# Patient Record
Sex: Female | Born: 1985 | Race: White | Hispanic: No | Marital: Single | Smoking: Never smoker
Health system: Southern US, Community
[De-identification: ages and names within clinical notes are randomized; demographics above are authoritative.]

## PROBLEM LIST (undated history)

## (undated) ENCOUNTER — Emergency Department: Discharge status: Absent without leave | Payer: Self-pay | Source: Home / Self Care

## (undated) HISTORY — PX: OTHER SURGICAL HISTORY: SHX169

---
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
## 2005-10-19 ENCOUNTER — Other Ambulatory Visit: Admission: RE | Admit: 2005-10-19 | Discharge: 2005-10-19 | Payer: Self-pay | Admitting: Gynecology

## 2006-10-22 ENCOUNTER — Other Ambulatory Visit: Admission: RE | Admit: 2006-10-22 | Discharge: 2006-10-22 | Payer: Self-pay | Admitting: Gynecology

## 2007-11-14 ENCOUNTER — Other Ambulatory Visit: Admission: RE | Admit: 2007-11-14 | Discharge: 2007-11-14 | Payer: Self-pay | Admitting: Gynecology

## 2008-05-10 ENCOUNTER — Ambulatory Visit: Payer: Self-pay | Admitting: Gynecology

## 2008-11-16 ENCOUNTER — Ambulatory Visit: Payer: Self-pay | Admitting: Gynecology

## 2008-11-16 ENCOUNTER — Other Ambulatory Visit: Admission: RE | Admit: 2008-11-16 | Discharge: 2008-11-16 | Payer: Self-pay | Admitting: Gynecology

## 2008-11-16 ENCOUNTER — Encounter: Payer: Self-pay | Admitting: Gynecology

## 2009-09-04 ENCOUNTER — Ambulatory Visit: Payer: Self-pay | Admitting: Gynecology

## 2009-09-26 ENCOUNTER — Encounter (INDEPENDENT_AMBULATORY_CARE_PROVIDER_SITE_OTHER): Payer: Self-pay | Admitting: *Deleted

## 2009-10-02 ENCOUNTER — Ambulatory Visit: Payer: Self-pay | Admitting: Infectious Diseases

## 2009-10-02 LAB — CONVERTED CEMR LAB: Chlamydia, DNA Probe: NEGATIVE

## 2009-10-07 ENCOUNTER — Telehealth: Payer: Self-pay | Admitting: Infectious Diseases

## 2009-10-15 ENCOUNTER — Telehealth: Payer: Self-pay | Admitting: Infectious Diseases

## 2009-10-23 ENCOUNTER — Telehealth: Payer: Self-pay | Admitting: Infectious Diseases

## 2009-10-25 ENCOUNTER — Encounter: Payer: Self-pay | Admitting: Infectious Diseases

## 2009-11-18 ENCOUNTER — Other Ambulatory Visit: Admission: RE | Admit: 2009-11-18 | Discharge: 2009-11-18 | Payer: Self-pay | Admitting: Gynecology

## 2009-11-18 ENCOUNTER — Ambulatory Visit: Payer: Self-pay | Admitting: Gynecology

## 2009-12-16 ENCOUNTER — Ambulatory Visit: Payer: Self-pay | Admitting: Gynecology

## 2010-09-25 NOTE — Progress Notes (Signed)
Summary: Lab results   Phone Note Call from Patient   Caller: Patient Summary of Call: Calling for lab results .    Initial call taken by: Tomasita Morrow RN,  October 07, 2009 9:31 AM  Follow-up for Phone Call        probe shows BV. will tx with flagyl for 7 days. called and explained to pt. she will call back at end of week if she does not feel better or sooner if she cannot tolerate med.     New/Updated Medications: FLAGYL 500 MG TABS (METRONIDAZOLE) Take 1 tablet by mouth two times a day Prescriptions: FLAGYL 500 MG TABS (METRONIDAZOLE) Take 1 tablet by mouth two times a day  #14 x 0   Entered and Authorized by:   Johny Sax MD   Signed by:   Johny Sax MD on 10/07/2009   Method used:   Electronically to        [facility] [address]* (retail)       [address] [place], [place]  [address]       Ph: [phone] or [phone]       Fax: [phone]   RxID:   [id number]

## 2010-09-25 NOTE — Assessment & Plan Note (Signed)
Summary: NEW PT CHRONIC YEAST   CC:  new patient - chronic yeast.  History of Present Illness: 25 yo F with recurrent yeast infections for last 5-6 years. Has tried diflucan, boric acid, multiple creams without relief. Has d/ thickc- everyday. No burning or itching.  after using boric acid she has improvement but returns in 24 h. the creams provide no relief.  has had HIV testing negative pervious, last 2 years ago.  has been on OCP since 2005. Had d/c then, unchanged with starting OCP.   Preventive Screening-Counseling & Management  Alcohol-Tobacco     Alcohol drinks/day: 0     Smoking Status: never  Caffeine-Diet-Exercise     Caffeine use/day: sodas     Does Patient Exercise: yes     Type of exercise: gym membership     Exercise (avg: min/session): >60     Times/week: 4  Safety-Violence-Falls     Seat Belt Use: yes   Updated Prior Medication List: VITAMIN D3 1000 UNIT CAPS (CHOLECALCIFEROL) Take 2 tablets by mouth once a day per Dr. Carney Harder 0.15-0.02/0.01 MG (21/5) TABS (DESOGESTREL-ETHINYL ESTRADIOL) per Dr. Audie Box  Current Allergies (reviewed today): ! CECLOR Past History:  Past Medical History: Current Problems:  CANDIDIASIS, VAGINAL (ICD-112.1)  Family History: denies  Social History: Single Never Smoked Alcohol use-no  Review of Systems       nl BM, nl urination, wt steady, eats well, nl periods  (has gotten longer), no oral ulcers, no visual changes, no dyspareunia.   Vital Signs:  Patient profile:   25 year old female Height:      61 inches (154.94 cm) Weight:      114.6 pounds (52.09 kg) BMI:     21.73 Temp:     96.9 degrees F (36.06 degrees C) oral Pulse rate:   64 / minute BP sitting:   104 / 55  (right arm)  Vitals Entered By: Baxter Hire) (October 02, 2009 2:19 PM) CC: new patient - chronic yeast Is Patient Diabetic? No Pain Assessment Patient in pain? no      Nutritional Status BMI of 19 -24 = normal Nutritional  Status Detail appetite is good per patient  Does patient need assistance? Functional Status Self care Ambulation Normal   Physical Exam  General:  well-developed, well-nourished, and well-hydrated.   Eyes:  pupils equal, pupils round, and pupils reactive to light.   Mouth:  pharynx pink and moist and no exudates.  no ulcers. Neck:  no masses.   Lungs:  normal respiratory effort and normal breath sounds.   Heart:  normal rate, regular rhythm, and no murmur.   Abdomen:  soft, non-tender, and normal bowel sounds.   Genitalia:  no external lesions, or lesions at Os. there is dry, whitish, d/c present. no odor.  Extremities:  no edema.    Impression & Recommendations:  Problem # 1:  CANDIDIASIS, VAGINAL (ICD-112.1)  her course is fairly odd. I am less suspicious that this is chronic candida vaginits given the appearance, lack of symptoms, and lack of response. She said she has been tested for Diabetes, it would be worth testing her thyroid functions as well ( she does not want blood work done today). Other considerations would be physiologic leukorrhea, desquamative inflamatory vaginitis.  Will leave these as dx of exclusion, currently test her for BV, fungus, GC, chlamydia, trich.  she will call office on 2-14 to discuss results.   Orders: T-Culture, Fungus w/o Smear ([id number]) T-Wet Prep by  Molecular Probe [phone]) T-Culture, Genital [phone]) T- * Misc. Laboratory test [phone]) T- GC Chlamydia ([id number]) Consultation Level IV [phone])

## 2010-09-25 NOTE — Progress Notes (Signed)
Summary: Symptoms continue, doesn't understand referral to Derm  Phone Note Call from Patient Call back at Home Phone [phone]   Caller: Patient Reason for Call: Acute Illness, Talk to Doctor Summary of Call: Pt. shared that she continues to have symptoms.  When she was taking the medication that Dr. Ninetta Lights prescribed she was on her period and didn't notice that her symptoms had returned.  She did return to her GYN MD.  Dr. Audie Box tried another medication without success.  Pt. does not understand how a Dermatologist is going to help this problem.  Please advise. Jennet Maduro RN  October 23, 2009 11:15 AM   Follow-up for Phone Call        Pt has called again, she does not understand why Dr Ninetta Lights is sending her to the dermatologist for a vaginal problem. She would like to repeat another round of the flagyl since it may have helped a bit. She is not sure if it did not work because she was on her period.  I spoke with Dr Ninetta Lights and he suggest that she not repeat the Flagyl and make the derm appt for an eval. They may choose to treat her problem with a different medication. Pt was informed of the above.  She did not seem pleased. Tomasita Morrow RN  October 23, 2009 4:14 PM

## 2010-09-25 NOTE — Miscellaneous (Signed)
Summary: Problems and Medications  Clinical Lists Changes  Problems: Added new problem of CANDIDIASIS, VAGINAL (ICD-112.1) - recurrent - Signed Medications: Added new medication of VITAMIN D3 1000 UNIT CAPS (CHOLECALCIFEROL) Take 2 tablets by mouth once a day per Dr. Audie Box - Signed Added new medication of KARIVA 0.15-0.02/0.01 MG (21/5) TABS (DESOGESTREL-ETHINYL ESTRADIOL) per Dr. Audie Box - Signed  Appended Document: Problems, Medications, and Allergies

## 2010-09-25 NOTE — Miscellaneous (Signed)
Summary: Dermatology Specialist  Dermatology Specialist   Imported By: Florinda Marker 11/11/2009 13:45:50  _____________________________________________________________________  External Attachment:    Type:   Image     Comment:   External Document

## 2010-09-25 NOTE — Progress Notes (Signed)
Summary: patient still having sxs/TY  Phone Note Call from Patient Call back at Home Phone [phone]   Caller: Patient Call For: Johny Sax MD Summary of Call: Patient called stating that the medication didnt help her symptoms, would like to know what else she could do? Initial call taken by: Starleen Arms CMA,  October 15, 2009 9:32 AM  Follow-up for Phone Call        would have derm see her.      Appended Document: patient still having sxs/TY Pt notified the above. Per Dr Ninetta Lights labs were normal and did not show and infectious disease present. Laurell Josephs, RN   Appended Document: patient still having sxs/TY 10-17-09  Pt was informed via phone of the above. She will contact her gyn or primary care physician for derm. referral. Laurell Josephs

## 2010-09-25 NOTE — Miscellaneous (Signed)
Summary: HIPAA Restrictions  HIPAA Restrictions   Imported By: Florinda Marker 10/02/2009 15:37:46  _____________________________________________________________________  External Attachment:    Type:   Image     Comment:   External Document

## 2010-10-20 ENCOUNTER — Ambulatory Visit (INDEPENDENT_AMBULATORY_CARE_PROVIDER_SITE_OTHER): Payer: Commercial Managed Care - PPO | Admitting: Family Medicine

## 2010-10-20 ENCOUNTER — Encounter: Payer: Self-pay | Admitting: Family Medicine

## 2010-10-20 DIAGNOSIS — J069 Acute upper respiratory infection, unspecified: Secondary | ICD-10-CM

## 2010-10-30 NOTE — Assessment & Plan Note (Signed)
Summary: POSS SINUS INF./WSE(rm2)   Vital Signs:  Patient Profile:   25 Years Old Female CC:      sinus problems Height:     61 inches (154.94 cm) Weight:      110.5 pounds O2 Sat:      100 % O2 treatment:    Room Air Temp:     98.8 degrees F oral Resp:     18 per minute BP sitting:   107 / 69  (left arm) Cuff size:   sinregular  Vitals Entered By: Burnard Hawthorne RN (October 20, 2010 6:45 PM)                  Updated Prior Medication List: VITAMIN D3 1000 UNIT CAPS (CHOLECALCIFEROL) Take 2 tablets by mouth once a day per Dr. Carney Harder 0.15-0.02/0.01 MG (21/5) TABS (DESOGESTREL-ETHINYL ESTRADIOL) per Dr. Audie Box  Current Allergies (reviewed today): ! CECLORHistory of Present Illness Chief Complaint: sinus problems History of Present Illness:  Subjective: Patient complains of sore throat for 2 days. + cough No pleuritic pain No wheezing + nasal congestion ? post-nasal drainage + sinus pain/pressure No itchy/red eyes No earache No hemoptysis No SOB No fever/chills No nausea No vomiting No abdominal pain No diarrhea No skin rashes + fatigue No myalgias No headache Used OTC meds without relief   REVIEW OF SYSTEMS Constitutional Symptoms      Denies fever, chills, night sweats, weight loss, weight gain, and fatigue.  Eyes       Complains of eye pain.      Denies change in vision, eye discharge, glasses, contact lenses, and eye surgery. Ear/Nose/Throat/Mouth       Complains of sinus problems and sore throat.      Denies hearing loss/aids, change in hearing, ear pain, ear discharge, dizziness, frequent runny nose, frequent nose bleeds, hoarseness, and tooth pain or bleeding.  Respiratory       Complains of dry cough.      Denies productive cough, wheezing, shortness of breath, asthma, bronchitis, and emphysema/COPD.  Cardiovascular       Denies murmurs, chest pain, and tires easily with exhertion.    Gastrointestinal       Denies stomach pain,  nausea/vomiting, diarrhea, constipation, blood in bowel movements, and indigestion. Genitourniary       Denies painful urination, kidney stones, and loss of urinary control. Neurological       Denies paralysis, seizures, and fainting/blackouts. Musculoskeletal       Denies muscle pain, joint pain, joint stiffness, decreased range of motion, redness, swelling, muscle weakness, and gout.  Skin       Denies bruising, unusual mles/lumps or sores, and hair/skin or nail changes.  Psych       Denies mood changes, temper/anger issues, anxiety/stress, speech problems, depression, and sleep problems. Other Comments: states symptoms started Friday   Past History:  Family History: Reviewed history from 10/02/2009 and no changes required. denies  Social History: Reviewed history from 10/02/2009 and no changes required. Single Never Smoked Alcohol use-no   Objective:  Appearance:  Patient appears healthy, stated age, and in no acute distress  Eyes:  Pupils are equal, round, and reactive to light and accomdation.  Extraocular movement is intact.  Conjunctivae are not inflamed.  Ears:  Canals normal.  Tympanic membranes normal.   Nose:  Mildly congested; no sinus tenderness Pharynx:  Normal  Neck:  Supple.  Slightly tender shotty posterior nodes are palpated bilaterally.  Lungs:  Clear to auscultation.  Breath sounds are equal.  Heart:  Regular rate and rhythm without murmurs, rubs, or gallops.  Abdomen:  Nontender without masses or hepatosplenomegaly.  Bowel sounds are present.  No CVA or flank tenderness.  Skin:  no rash Assessment New Problems: UPPER RESPIRATORY INFECTION, ACUTE (ICD-465.9)  NO EVIDENCE BACTERIAL INFECTION TODAY  Plan New Medications/Changes: AZITHROMYCIN 250 MG TABS (AZITHROMYCIN) Two tabs by mouth on day 1, then 1 tab daily on days 2 through 5 (Rx void after 10/30/10)  #6 tabs x 0, 10/20/2010, Donna Christen MD BENZONATATE 200 MG CAPS (BENZONATATE) One by mouth hs as  needed cough  #12 x 0, 10/20/2010, Donna Christen MD  New Orders: Pulse Oximetry (single measurment) [04540] New Patient Level III [98119] Planning Comments:   Treat symptomatically for now:  Increase fluid intake, begin expectorant/decongestant, cough suppressant at bedtime.  If fever/chills/sweats persist, or if not improving 5 to 7 days begin Z-pack (given Rx to hold).  Followup with PCP if not improving 10 to 14 days.   The patient and/or caregiver has been counseled thoroughly with regard to medications prescribed including dosage, schedule, interactions, rationale for use, and possible side effects and they verbalize understanding.  Diagnoses and expected course of recovery discussed and will return if not improved as expected or if the condition worsens. Patient and/or caregiver verbalized understanding.  Prescriptions: AZITHROMYCIN 250 MG TABS (AZITHROMYCIN) Two tabs by mouth on day 1, then 1 tab daily on days 2 through 5 (Rx void after 10/30/10)  #6 tabs x 0   Entered and Authorized by:   Donna Christen MD   Signed by:   Donna Christen MD on 10/20/2010   Method used:   Print then Give to Patient   RxID:   [id number] BENZONATATE 200 MG CAPS (BENZONATATE) One by mouth hs as needed cough  #12 x 0   Entered and Authorized by:   Donna Christen MD   Signed by:   Donna Christen MD on 10/20/2010   Method used:   Print then Give to Patient   RxID:   [id number]   Patient Instructions: 1)  Take Mucinex D (guaifenesin with decongestant) twice daily for congestion. 2)  Increase fluid intake, rest. 3)  May use Afrin nasal spray (or generic oxymetazoline) twice daily for about 5 days.  Also recommend using saline nasal spray several times daily and/or saline nasal irrigation. 4)  Begin Azithromycin if not improving about 5 to 7 days or if persistent fever develops. 5)  Followup with family doctor if not improving 10 to 14 days.   Orders Added: 1)  Pulse Oximetry (single measurment)  [94760] 2)  New Patient Level III [01027]

## 2010-11-20 ENCOUNTER — Other Ambulatory Visit: Payer: Self-pay | Admitting: Gynecology

## 2010-11-20 ENCOUNTER — Encounter (INDEPENDENT_AMBULATORY_CARE_PROVIDER_SITE_OTHER): Payer: Commercial Managed Care - PPO | Admitting: Gynecology

## 2010-11-20 ENCOUNTER — Other Ambulatory Visit (HOSPITAL_COMMUNITY)
Admission: RE | Admit: 2010-11-20 | Discharge: 2010-11-20 | Disposition: A | Discharge status: Home / Self Care | Payer: Commercial Managed Care - PPO | Source: Ambulatory Visit | Attending: Gynecology | Admitting: Gynecology

## 2010-11-20 DIAGNOSIS — B373 Candidiasis of vulva and vagina: Secondary | ICD-10-CM

## 2010-11-20 DIAGNOSIS — Z833 Family history of diabetes mellitus: Secondary | ICD-10-CM

## 2010-11-20 DIAGNOSIS — Z01419 Encounter for gynecological examination (general) (routine) without abnormal findings: Secondary | ICD-10-CM

## 2010-11-20 DIAGNOSIS — Z124 Encounter for screening for malignant neoplasm of cervix: Secondary | ICD-10-CM | POA: Insufficient documentation

## 2010-11-20 DIAGNOSIS — Z13220 Encounter for screening for lipoid disorders: Secondary | ICD-10-CM

## 2010-11-20 DIAGNOSIS — R823 Hemoglobinuria: Secondary | ICD-10-CM

## 2010-11-20 DIAGNOSIS — Z113 Encounter for screening for infections with a predominantly sexual mode of transmission: Secondary | ICD-10-CM

## 2011-02-09 ENCOUNTER — Inpatient Hospital Stay (INDEPENDENT_AMBULATORY_CARE_PROVIDER_SITE_OTHER)
Admission: RE | Admit: 2011-02-09 | Discharge: 2011-02-09 | Disposition: A | Discharge status: Home / Self Care | Payer: Commercial Managed Care - PPO | Source: Ambulatory Visit | Attending: Family Medicine | Admitting: Family Medicine

## 2011-02-09 ENCOUNTER — Encounter: Payer: Self-pay | Admitting: Family Medicine

## 2011-02-09 DIAGNOSIS — R599 Enlarged lymph nodes, unspecified: Secondary | ICD-10-CM | POA: Insufficient documentation

## 2011-02-09 DIAGNOSIS — H6980 Other specified disorders of Eustachian tube, unspecified ear: Secondary | ICD-10-CM | POA: Insufficient documentation

## 2011-02-09 DIAGNOSIS — H6500 Acute serous otitis media, unspecified ear: Secondary | ICD-10-CM

## 2011-02-09 DIAGNOSIS — H6990 Unspecified Eustachian tube disorder, unspecified ear: Secondary | ICD-10-CM | POA: Insufficient documentation

## 2011-06-05 ENCOUNTER — Telehealth: Payer: Self-pay | Admitting: *Deleted

## 2011-06-05 NOTE — Telephone Encounter (Signed)
Pt called wanting prescription for depo-provera sent to her pharmacy. Last office note states pt was on birth control pills, pt informed she would need to get the depo from dr. Randa Evens office, being that she wrote Rx.

## 2011-07-27 NOTE — Progress Notes (Signed)
Summary: EARACHE/SWOLLEN GLAND rm 5   Vital Signs:  Patient Profile:   25 Years Old Female CC:      RT ear ache and lump behind RT ear x 5 days Height:     61 inches (154.94 cm) Weight:      109.50 pounds O2 Sat:      100 % O2 treatment:    Room Air Temp:     98.6 degrees F oral Pulse rate:   77 / minute Resp:     14 per minute BP sitting:   96 / 60  (left arm) Cuff size:   regular  Vitals Entered By: Clemens Catholic LPN (February 09, 2011 6:28 PM)                  Prior Medication List:  KARIVA 0.15-0.02/0.01 MG (21/5) TABS (DESOGESTREL-ETHINYL ESTRADIOL) per Dr. Audie Box BENZONATATE 200 MG CAPS (BENZONATATE) One by mouth hs as needed cough AZITHROMYCIN 250 MG TABS (AZITHROMYCIN) Two tabs by mouth on day 1, then 1 tab daily on days 2 through 5 (Rx void after 10/30/10)   Updated Prior Medication List: KARIVA 0.15-0.02/0.01 MG (21/5) TABS (DESOGESTREL-ETHINYL ESTRADIOL) per Dr. Audie Box  Current Allergies (reviewed today): ! CECLORHistory of Present Illness Chief Complaint: RT ear ache and lump behind RT ear x 5 days History of Present Illness: patient states ear pain for 2 days but painful lymph nodes f0r 5 days.  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 Valda Lamb [facility] 180-240 MG XR24H-TAB (FEXOFENADINE-PSEUDOEPHEDRINE) 1 by mouth q day AMOXICILLIN 875 MG TABS (AMOXICILLIN) 1 by mouth twwice a day DIFLUCAN 150 MG TABS (FLUCONAZOLE) once daily if neede for yeast infection  REVIEW OF SYSTEMS Constitutional Symptoms      Denies fever, chills, night sweats, weight loss, weight gain, and fatigue.  Eyes       Denies change in vision, eye pain, eye discharge, glasses, contact lenses, and eye surgery. Ear/Nose/Throat/Mouth       Complains of ear pain, ear  discharge, and sore throat.      Denies hearing loss/aids, change in hearing, dizziness, frequent runny nose, frequent nose bleeds, sinus problems, hoarseness, and tooth pain or bleeding.  Respiratory       Denies dry cough, productive cough, wheezing, shortness of breath, asthma, bronchitis, and emphysema/COPD.  Cardiovascular       Denies murmurs, chest pain, and tires easily with exhertion.    Gastrointestinal       Denies stomach pain, nausea/vomiting, diarrhea, constipation, blood in bowel movements, and indigestion. Genitourniary       Denies painful urination, kidney stones, and loss of urinary control. Neurological       Denies paralysis, seizures, and fainting/blackouts. Musculoskeletal       Denies muscle pain, joint pain, joint stiffness, decreased range of motion, redness, swelling, muscle weakness, and gout.  Skin       Denies bruising, unusual mles/lumps or sores, and hair/skin or nail changes.  Psych       Denies mood changes, temper/anger issues, anxiety/stress, speech problems, depression, and sleep problems. Blood-Lymph       Complains of unexplained lumps. Other Comments: pt c/o a lump behind her RT ear x 5 days, she developed Rt ear pain x yesterday. no fever. no OTC meds.   Past History:  Social History: Last updated: 10/02/2009 Single Never Smoked Alcohol use-no  Risk Factors: Alcohol Use: 0 (10/02/2009) Caffeine Use: sodas (10/02/2009) Exercise: yes (10/02/2009)  Risk Factors: Smoking Status: never (10/02/2009)  Past Medical History: Reviewed history from 10/02/2009 and no changes required. Current Problems:  CANDIDIASIS, VAGINAL (ICD-112.1)  Family History: Reviewed history from 10/02/2009 and no changes required. denies  Social History: Reviewed history from 10/02/2009 and no changes required. Single Never Smoked Alcohol use-no Physical Exam General appearance: well developed, well nourished,anxious Head: normocephalic, atraumatic Ears:  fluid noted without inflammation right TM Nasal: marked sinus and nasal congestion Oral/Pharynx: pharyngeal erythema without exudate, uvula midline without deviation Neck: supple tender R cervical anterir lymph node  Extremities: normal extremities Skin: no obvious rashes or lesions MSE: oriented to time, place, and person Assessment New Problems: CERVICAL LYMPHADENOPATHY (ICD-785.6) ACUTE SEROUS OTITIS MEDIA (ICD-381.01) EUSTACHIAN TUBE DYSFUNCTION, 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 Patient Level III [phone] Planning Comments:   if lymph nodes does not resolve uin 2-3 weeks please seePCP  Follow Up: Follow up with Primary Physician Follow Up: 2-3  The patient and/or caregiver has been counseled thoroughly with regard to medications prescribed including dosage, schedule, interactions, rationale for use, and possible side effects and they verbalize understanding.  Diagnoses and expected course of recovery discussed and will return if not improved as expected or if the condition worsens. Patient and/or caregiver verbalized understanding.  Prescriptions: DIFLUCAN 150 MG TABS (FLUCONAZOLE) once daily if neede for yeast infection  #1 x 0   Entered and Authorized by:   Hassan Rowan MD   Signed by:   Hassan Rowan MD on 02/09/2011   Method used:   Print then Give to Patient   RxID:   [id number] AMOXICILLIN 875 MG TABS (AMOXICILLIN) 1 by mouth twwice a day  #20 x 0   Entered and Authorized by:   Hassan Rowan MD   Signed by:   Hassan Rowan MD on 02/09/2011   Method used:   Print then Give to Patient    RxID:   [id number] ALLEGRA-D ALLERGY & CONGESTION 180-240 MG XR24H-TAB (FEXOFENADINE-PSEUDOEPHEDRINE) 1 by mouth q day  #30 x 0   Entered and Authorized by:   Hassan Rowan MD   Signed by:   Hassan Rowan MD on 02/09/2011   Method used:   Print then Give to Patient   RxID:   [id number]   Patient Instructions: 1)  before you take Amoxicillin make sure you have taken a peniciillin since the ceclor allergy 2)  folow up w/PCP if adenopathy is not better in 2-3 weeks 3)  Please schedule a follow-up appointment as needed. 4)  Please schedule an appointment with your primary doctor in :2-3 weks 5)  Take your antibiotic as prescribed until ALL of it is gone, but stop if you develop a rash or swelling and contact our office as soon as possible.  Orders Added: 1)  Est. Patient Level III [25366]

## 2011-08-30 ENCOUNTER — Encounter: Payer: Self-pay | Admitting: Emergency Medicine

## 2011-08-30 ENCOUNTER — Emergency Department
Admission: EM | Admit: 2011-08-30 | Discharge: 2011-08-30 | Disposition: A | Discharge status: Home / Self Care | Payer: 59 | Source: Home / Self Care | Attending: Emergency Medicine | Admitting: Emergency Medicine

## 2011-08-30 DIAGNOSIS — R350 Frequency of micturition: Secondary | ICD-10-CM

## 2011-08-30 DIAGNOSIS — IMO0001 Reserved for inherently not codable concepts without codable children: Secondary | ICD-10-CM

## 2011-08-30 DIAGNOSIS — R300 Dysuria: Secondary | ICD-10-CM

## 2011-08-30 LAB — POCT URINALYSIS DIPSTICK
Bilirubin, UA: NEGATIVE
Ketones, UA: NEGATIVE
Leukocytes, UA: NEGATIVE
Spec Grav, UA: 1.02 (ref 1.005–1.03)

## 2011-08-30 MED ORDER — CIPROFLOXACIN HCL 500 MG PO TABS: 500.0000 mg | ORAL_TABLET | Freq: Two times a day (BID) | ORAL | Status: AC

## 2011-08-30 NOTE — ED Notes (Signed)
Frequency of urination x 4 days; dysuria started today.

## 2011-08-30 NOTE — ED Provider Notes (Signed)
History     CSN: [id number]  Arrival date & time 08/30/11  1502   First MD Initiated Contact with Patient 08/30/11 1526      No chief complaint on file.   (Consider location/radiation/quality/duration/timing/severity/associated sxs/prior treatment) HPI Amy Weeks is a 26 y.o. female who presents today with UTI symptoms for 4 days.  These symptoms similar to previous UTIs. ? dysuria + frequency No urgency No hematuria No vaginal discharge No fever/chills No lower abdominal pain No back pain No fatigue    No past medical history on file.  No past surgical history on file.  No family history on file.  History  Substance Use Topics  . Smoking status: Not on file  . Smokeless tobacco: Not on file  . Alcohol Use: Not on file    OB History    No data available      Review of Systems  Allergies  Cefaclor  Home Medications  No current outpatient prescriptions on file.  There were no vitals taken for this visit.  Physical Exam  Nursing note and vitals reviewed. Constitutional: She is oriented to person, place, and time. She appears well-developed and well-nourished.  HENT:  Head: Normocephalic and atraumatic.  Eyes: No scleral icterus.  Neck: Neck supple.  Cardiovascular: Regular rhythm and normal heart sounds.   Pulmonary/Chest: Effort normal and breath sounds normal. No respiratory distress.  Abdominal: Soft. Normal appearance and bowel sounds are normal. She exhibits no mass. There is no rebound, no guarding and no CVA tenderness.  Neurological: She is alert and oriented to person, place, and time.  Skin: Skin is warm and dry.  Psychiatric: She has a normal mood and affect. Her speech is normal.    ED Course  Procedures (including critical care time)  Labs Reviewed - No data to display No results found.   No diagnosis found.    MDM  1) Take the prescribed antibiotic as directed. 2) A urinalysis was done in clinic.  A urine culture is pending. 3)  Follow up with your PCP or urologist if not improving or if worsening symptoms.   Lily Kocher, MD 08/30/11 1538

## 2011-09-02 ENCOUNTER — Telehealth: Payer: Self-pay | Admitting: Emergency Medicine

## 2011-12-02 ENCOUNTER — Other Ambulatory Visit: Payer: Self-pay | Admitting: Gynecology

## 2011-12-07 ENCOUNTER — Other Ambulatory Visit: Payer: Self-pay | Admitting: Obstetrics and Gynecology

## 2012-04-22 ENCOUNTER — Emergency Department
Admission: EM | Admit: 2012-04-22 | Discharge: 2012-04-22 | Disposition: A | Discharge status: Home / Self Care | Payer: 59 | Source: Home / Self Care

## 2012-04-22 ENCOUNTER — Encounter: Payer: Self-pay | Admitting: *Deleted

## 2012-04-22 DIAGNOSIS — J029 Acute pharyngitis, unspecified: Secondary | ICD-10-CM

## 2012-04-22 DIAGNOSIS — J020 Streptococcal pharyngitis: Secondary | ICD-10-CM

## 2012-04-22 LAB — POCT RAPID STREP A (OFFICE): Rapid Strep A Screen: NEGATIVE

## 2012-04-22 MED ORDER — FLUCONAZOLE 150 MG PO TABS: 150.0000 mg | ORAL_TABLET | Freq: Once | ORAL | Status: AC

## 2012-04-22 MED ORDER — PENICILLIN G BENZATHINE 1200000 UNIT/2ML IM SUSP
1.2000 10*6.[IU] | Freq: Once | INTRAMUSCULAR | Status: AC
  Administered 2012-04-22: 1.2 10*6.[IU] via INTRAMUSCULAR

## 2012-04-22 NOTE — ED Notes (Signed)
Pt c/o sore throat and bilateral ear ache x 2 days. She has taken mucinex and advil.

## 2012-04-22 NOTE — ED Provider Notes (Signed)
History     CSN: [id number]  Arrival date & time 04/22/12  1704   First MD Initiated Contact with Patient 04/22/12 1707      Chief Complaint  Patient presents with  . Sore Throat   HPI SORE THROAT  Onset: 2 days  Description: sore throat, generalized malaise  Modifying factors: hx/o strep throat in the past  Symptoms  Fever:  no URI symptoms: no Cough: no Headache: yes Rash:  no Swollen glands:   yes Recent Strep Exposure: unsure LUQ pain: no Heartburn/brash: no Allergy Symptoms: no  Red Flags STD exposure: no Breathing difficulty: no Drooling: no Trismus: no   History reviewed. No pertinent past medical history.  Past Surgical History  Procedure Date  . Tubes in ears     Family History  Problem Relation Age of Onset  . Hypertension Mother     History  Substance Use Topics  . Smoking status: Never Smoker   . Smokeless tobacco: Not on file  . Alcohol Use: No    OB History    Grav Para Term Preterm Abortions TAB SAB Ect Mult Living                  Review of Systems  All other systems reviewed and are negative.    Allergies  Ceclor  Home Medications   Current Outpatient Rx  Name Route Sig Dispense Refill  . MEDROXYPROGESTERONE ACETATE 150 MG/ML IM SUSP Intramuscular Inject 150 mg into the muscle every 3 (three) months.      Marland Kitchen VIORELE 0.15-0.02/0.01 MG (21/5) PO TABS  TAKE 1 TABLET BY MOUTH EVERY DAY AS DIRECTED 28 tablet 0    Patient due for annual exam and needs to schedule.    BP 111/74  Pulse 91  Temp 98.3 F (36.8 C) (Oral)  Resp 18  Ht 5\' 1"  (1.549 m)  Wt 112 lb (50.803 kg)  BMI 21.16 kg/m2  SpO2 99%  LMP 04/13/2012  Physical Exam  Constitutional: She appears well-developed and well-nourished.  HENT:  Head: Normocephalic and atraumatic.  Mouth/Throat: Oropharyngeal exudate present.  Eyes: Conjunctivae are normal. Pupils are equal, round, and reactive to light.  Neck: Normal range of motion. Neck supple.    Cardiovascular: Normal rate and regular rhythm.   Pulmonary/Chest: Effort normal and breath sounds normal.  Abdominal: Soft.  Musculoskeletal: Normal range of motion.  Lymphadenopathy:    She has cervical adenopathy.  Neurological: She is alert.  Skin: Skin is warm.    ED Course  Procedures (including critical care time)   Labs Reviewed  POCT RAPID STREP A (OFFICE)  STREP A DNA PROBE   No results found.   1. Strep throat   2. Pharyngitis       MDM  Rapid strep negative. Centor Score of 3/4 Will treat with bicillin. Pt states she has had this before and has treated this effectively in setting on ceclor allergy.   Throat culture. Discussed infectious red flags.  Follow up as needed.     The patient and/or caregiver has been counseled thoroughly with regard to treatment plan and/or medications prescribed including dosage, schedule, interactions, rationale for use, and possible side effects and they verbalize understanding. Diagnoses and expected course of recovery discussed and will return if not improved as expected or if the condition worsens. Patient and/or caregiver verbalized understanding.           Doree Albee, MD 04/22/12 [phone]

## 2012-04-23 LAB — STREP A DNA PROBE: GASP: NEGATIVE

## 2012-12-14 ENCOUNTER — Other Ambulatory Visit: Payer: Self-pay | Admitting: Obstetrics and Gynecology

## 2013-03-02 ENCOUNTER — Encounter: Payer: Self-pay | Admitting: Internal Medicine

## 2013-03-02 ENCOUNTER — Ambulatory Visit (INDEPENDENT_AMBULATORY_CARE_PROVIDER_SITE_OTHER): Payer: Managed Care, Other (non HMO) | Admitting: Internal Medicine

## 2013-03-02 VITALS — BP 108/72 | HR 77 | Temp 97.3°F | Ht 61.0 in | Wt 116.0 lb

## 2013-03-02 DIAGNOSIS — J019 Acute sinusitis, unspecified: Secondary | ICD-10-CM

## 2013-03-02 MED ORDER — AMOXICILLIN-POT CLAVULANATE 875-125 MG PO TABS: 1.0000 | ORAL_TABLET | Freq: Two times a day (BID) | ORAL | Status: DC

## 2013-03-02 NOTE — Patient Instructions (Signed)

## 2013-03-02 NOTE — Progress Notes (Signed)
HPI  Pt presents to the clinic today with c/o headache, facial pain, pressure, nasal congestion, fatigue and fever. This started 1 month ago. She is having green nasal drainage. She has taken Advil and Sudafed which has only helped a little. She has no history of allergies or asthma. She has not had sick contacts.  Review of Systems   History reviewed. No pertinent past medical history.  Family History  Problem Relation Age of Onset  . Hypertension Mother     History   Social History  . Marital Status: Single    Spouse Name: N/A    Number of Children: N/A  . Years of Education: N/A   Occupational History  . Not on file.   Social History Main Topics  . Smoking status: Never Smoker   . Smokeless tobacco: Not on file  . Alcohol Use: No  . Drug Use: No  . Sexually Active: Not on file   Other Topics Concern  . Not on file   Social History Narrative  . No narrative on file    Allergies  Allergen Reactions  . Ceclor (Cefaclor)      Constitutional: Positive headache, fatigue and fever. Denies abrupt weight changes.  HEENT:  Positive eye pain, pressure behind the eyes, facial pain, nasal congestion and sore throat. Denies eye redness, ear pain, ringing in the ears, wax buildup, runny nose or bloody nose. Respiratory: Positive cough and thick green sputum production. Denies difficulty breathing or shortness of breath.  Cardiovascular: Denies chest pain, chest tightness, palpitations or swelling in the hands or feet.   No other specific complaints in a complete review of systems (except as listed in HPI above).  Objective:    BP 108/72  Pulse 77  Temp(Src) 97.3 F (36.3 C) (Oral)  Ht 5\' 1"  (1.549 m)  Wt 116 lb (52.617 kg)  BMI 21.93 kg/m2  SpO2 99% Wt Readings from Last 3 Encounters:  03/02/13 116 lb (52.617 kg)  04/22/12 112 lb (50.803 kg)  08/30/11 110 lb (49.896 kg)    General: Appears her stated age, well developed, well nourished in NAD. HEENT: Head:  normal shape and size; Eyes: sclera white, no icterus, conjunctiva pink, PERRLA and EOMs intact; Ears: Tm's gray and intact, normal light reflex; Nose: mucosa pink and moist, septum midline; Throat/Mouth: + PND. Teeth present, mucosa pink and moist, no exudate noted, no lesions or ulcerations noted.  Neck: Mild cervical lymphadenopathy. Neck supple, trachea midline. No massses, lumps or thyromegaly present.  Cardiovascular: Normal rate and rhythm. S1,S2 noted.  No murmur, rubs or gallops noted. No JVD or BLE edema. No carotid bruits noted. Pulmonary/Chest: Normal effort and positive vesicular breath sounds. No respiratory distress. No wheezes, rales or ronchi noted.      Assessment & Plan:   Acute bacterial sinusitis  Can use a Neti Pot which can be purchased from your local drug store. Flonase 2 sprays each nostril for 3 days and then as needed. Augmentin BID for 10 days  RTC as needed or if symptoms persist.

## 2013-03-13 ENCOUNTER — Telehealth: Payer: Self-pay | Admitting: Internal Medicine

## 2013-03-13 ENCOUNTER — Other Ambulatory Visit: Payer: Self-pay | Admitting: Internal Medicine

## 2013-03-13 MED ORDER — DOXYCYCLINE HYCLATE 100 MG PO TABS: 100.0000 mg | ORAL_TABLET | Freq: Two times a day (BID) | ORAL | Status: DC

## 2013-03-13 NOTE — Telephone Encounter (Signed)
Pt is aware.  

## 2013-03-13 NOTE — Telephone Encounter (Signed)
LMOM to call.

## 2013-03-13 NOTE — Telephone Encounter (Signed)
Pt is having an allergic reaction to the antibiotics.  Throat was swelling, tongue swelled some.  She took benadryl and stopped the antibiotic.  She is better, but still needs an antibiotic.

## 2013-03-13 NOTE — Telephone Encounter (Signed)
Please call pt.  I added augmentin to her allergy list. Have called in eRx for Doxycycline.

## 2013-04-21 ENCOUNTER — Other Ambulatory Visit (INDEPENDENT_AMBULATORY_CARE_PROVIDER_SITE_OTHER): Payer: Managed Care, Other (non HMO)

## 2013-04-21 ENCOUNTER — Ambulatory Visit (INDEPENDENT_AMBULATORY_CARE_PROVIDER_SITE_OTHER): Payer: Managed Care, Other (non HMO) | Admitting: Internal Medicine

## 2013-04-21 ENCOUNTER — Encounter: Payer: Self-pay | Admitting: Internal Medicine

## 2013-04-21 VITALS — BP 98/64 | HR 76 | Temp 98.0°F | Wt 114.0 lb

## 2013-04-21 DIAGNOSIS — Z13220 Encounter for screening for lipoid disorders: Secondary | ICD-10-CM

## 2013-04-21 DIAGNOSIS — Z0000 Encounter for general adult medical examination without abnormal findings: Secondary | ICD-10-CM

## 2013-04-21 DIAGNOSIS — M5430 Sciatica, unspecified side: Secondary | ICD-10-CM

## 2013-04-21 DIAGNOSIS — Z131 Encounter for screening for diabetes mellitus: Secondary | ICD-10-CM

## 2013-04-21 DIAGNOSIS — Z23 Encounter for immunization: Secondary | ICD-10-CM

## 2013-04-21 DIAGNOSIS — Z130 Encounter for screening for diseases of the blood and blood-forming organs and certain disorders involving the immune mechanism: Secondary | ICD-10-CM

## 2013-04-21 LAB — CBC
Hemoglobin: 14.3 g/dL (ref 12.0–15.0)
MCHC: 34.6 g/dL (ref 30.0–36.0)
RDW: 13.1 % (ref 11.5–14.6)

## 2013-04-21 LAB — COMPREHENSIVE METABOLIC PANEL
Albumin: 3.7 g/dL (ref 3.5–5.2)
Alkaline Phosphatase: 35 U/L — ABNORMAL LOW (ref 39–117)
BUN: 8 mg/dL (ref 6–23)
Glucose, Bld: 84 mg/dL (ref 70–99)
Potassium: 4.3 mEq/L (ref 3.5–5.1)

## 2013-04-21 LAB — LIPID PANEL
Cholesterol: 203 mg/dL — ABNORMAL HIGH (ref 0–200)
HDL: 63.4 mg/dL (ref >39.00)
Triglycerides: 107 mg/dL (ref 0.0–149.0)

## 2013-04-21 LAB — LDL CHOLESTEROL, DIRECT: Direct LDL: 129.9 mg/dL

## 2013-04-21 MED ORDER — PREDNISONE 10 MG PO TABS: ORAL_TABLET | ORAL | Status: DC

## 2013-04-21 NOTE — Progress Notes (Signed)
HPI  Pt presents to the clinic today to establish care. She does not have a PCP. She does have some concerns today about pain in her lower back. This started 3 weeks ago. She denies any specific injury to it but she did notice it after working out with [employer]. The pain is in the middle of her back and has a sharp radiating pain down both of her legs, with certain movements. The pain seems to be worse we she sits or bends, but better if she stands up. She has take Advil with some relief.  Flu: never Tetanus: more than 10 years ago LMP: on continuous birth control Pap smear: 11/2011 Dentist: yearly  History reviewed. No pertinent past medical history.  Current Outpatient Prescriptions  Medication Sig Dispense Refill  . VIORELE 0.15-0.02/0.01 MG (21/5) tablet TAKE 1 TABLET BY MOUTH EVERY DAY AS DIRECTED  28 tablet  0  . doxycycline (VIBRA-TABS) 100 MG tablet Take 1 tablet (100 mg total) by mouth 2 (two) times daily.  20 tablet  0  . predniSONE (DELTASONE) 10 MG tablet Take 3 tablets on days 1-3, take 2 tablets on days 4-6, take 1 tablet on days 7-9  18 tablet  0   No current facility-administered medications for this visit.    Allergies  Allergen Reactions  . Augmentin [Amoxicillin-Pot Clavulanate] Swelling  . Ceclor [Cefaclor]     Family History  Problem Relation Age of Onset  . Hypertension Mother   . Cancer Neg Hx   . Diabetes Neg Hx   . Stroke Neg Hx     History   Social History  . Marital Status: Single    Spouse Name: N/A    Number of Children: N/A  . Years of Education: N/A   Occupational History  . Not on file.   Social History Main Topics  . Smoking status: Never Smoker   . Smokeless tobacco: Not on file  . Alcohol Use: No  . Drug Use: No  . Sexual Activity: Yes    Birth Control/ Protection: Pill   Other Topics Concern  . Not on file   Social History Narrative  . No narrative on file    ROS:  Constitutional: Denies fever, malaise, fatigue,  headache or abrupt weight changes.  HEENT: Denies eye pain, eye redness, ear pain, ringing in the ears, wax buildup, runny nose, nasal congestion, bloody nose, or sore throat. Respiratory: Denies difficulty breathing, shortness of breath, cough or sputum production.   Cardiovascular: Denies chest pain, chest tightness, palpitations or swelling in the hands or feet.  Gastrointestinal: Denies abdominal pain, bloating, constipation, diarrhea or blood in the stool.  GU: Denies frequency, urgency, pain with urination, blood in urine, odor or discharge. Musculoskeletal: Pt reports low back pain. Denies decrease in range of motion, difficulty with gait, muscle pain or joint pain and swelling.  Skin: Denies redness, rashes, lesions or ulcercations.  Neurological: Pt reports sharp, shooting pain down her thighs. Denies dizziness, difficulty with memory, difficulty with speech or problems with balance and coordination.   No other specific complaints in a complete review of systems (except as listed in HPI above).  PE:  BP 98/64  Pulse 76  Temp(Src) 98 F (36.7 C) (Oral)  Wt 114 lb (51.71 kg)  BMI 21.55 kg/m2  SpO2 99% Wt Readings from Last 3 Encounters:  04/21/13 114 lb (51.71 kg)  03/02/13 116 lb (52.617 kg)  04/22/12 112 lb (50.803 kg)    General: Appears her stated  age, well developed, well nourished in NAD. HEENT: Head: normal shape and size; Eyes: sclera white, no icterus, conjunctiva pink, PERRLA and EOMs intact; Ears: Tm's gray and intact, normal light reflex; Nose: mucosa pink and moist, septum midline; Throat/Mouth: Teeth present, mucosa pink and moist, no lesions or ulcerations noted.  Neck: Normal range of motion. Neck supple, trachea midline. No massses, lumps or thyromegaly present.  Cardiovascular: Normal rate and rhythm. S1,S2 noted.  No murmur, rubs or gallops noted. No JVD or BLE edema. No carotid bruits noted. Pulmonary/Chest: Normal effort and positive vesicular breath sounds.  No respiratory distress. No wheezes, rales or ronchi noted.  Abdomen: Soft and nontender. Normal bowel sounds, no bruits noted. No distention or masses noted. Liver, spleen and kidneys non palpable. Musculoskeletal: Normal range of motion. No signs of joint swelling. No difficulty with gait. Tender to palpation of the lumbar spine. Neurological: Alert and oriented. Cranial nerves II-XII intact. Coordination normal. +DTRs bilaterally. Psychiatric: Mood and affect normal. Behavior is normal. Judgment and thought content normal. Positive straight leg raise on the right.     Assessment and Plan:  Preventative Health:  Pt declines flu shot today Tdap given today Will obtain basic screening labs today Continue to follow with dentist yearly Paps every 3 years

## 2013-04-21 NOTE — Patient Instructions (Addendum)
Health Maintenance, Females A healthy lifestyle and preventative care can promote health and wellness.  Maintain regular health, dental, and eye exams.  Eat a healthy diet. Foods like vegetables, fruits, whole grains, low-fat dairy products, and lean protein foods contain the nutrients you need without too many calories. Decrease your intake of foods high in solid fats, added sugars, and salt. Get information about a proper diet from your caregiver, if necessary.  Regular physical exercise is one of the most important things you can do for your health. Most adults should get at least 150 minutes of moderate-intensity exercise (any activity that increases your heart rate and causes you to sweat) each week. In addition, most adults need muscle-strengthening exercises on 2 or more days a week.   Maintain a healthy weight. The body mass index (BMI) is a screening tool to identify possible weight problems. It provides an estimate of body fat based on height and weight. Your caregiver can help determine your BMI, and can help you achieve or maintain a healthy weight. For adults 20 years and older:  A BMI below 18.5 is considered underweight.  A BMI of 18.5 to 24.9 is normal.  A BMI of 25 to 29.9 is considered overweight.  A BMI of 30 and above is considered obese.  Maintain normal blood lipids and cholesterol by exercising and minimizing your intake of saturated fat. Eat a balanced diet with plenty of fruits and vegetables. Blood tests for lipids and cholesterol should begin at age 20 and be repeated every 5 years. If your lipid or cholesterol levels are high, you are over 50, or you are a high risk for heart disease, you may need your cholesterol levels checked more frequently.Ongoing high lipid and cholesterol levels should be treated with medicines if diet and exercise are not effective.  If you smoke, find out from your caregiver how to quit. If you do not use tobacco, do not start.  If you  are pregnant, do not drink alcohol. If you are breastfeeding, be very cautious about drinking alcohol. If you are not pregnant and choose to drink alcohol, do not exceed 1 drink per day. One drink is considered to be 12 ounces (355 mL) of beer, 5 ounces (148 mL) of wine, or 1.5 ounces (44 mL) of liquor.  Avoid use of street drugs. Do not share needles with anyone. Ask for help if you need support or instructions about stopping the use of drugs.  High blood pressure causes heart disease and increases the risk of stroke. Blood pressure should be checked at least every 1 to 2 years. Ongoing high blood pressure should be treated with medicines, if weight loss and exercise are not effective.  If you are 55 to 27 years old, ask your caregiver if you should take aspirin to prevent strokes.  Diabetes screening involves taking a blood sample to check your fasting blood sugar level. This should be done once every 3 years, after age 45, if you are within normal weight and without risk factors for diabetes. Testing should be considered at a younger age or be carried out more frequently if you are overweight and have at least 1 risk factor for diabetes.  Breast cancer screening is essential preventative care for women. You should practice "breast self-awareness." This means understanding the normal appearance and feel of your breasts and may include breast self-examination. Any changes detected, no matter how small, should be reported to a caregiver. Women in their 20s and 30s should have   a clinical breast exam (CBE) by a caregiver as part of a regular health exam every 1 to 3 years. After age 40, women should have a CBE every year. Starting at age 40, women should consider having a mammogram (breast X-ray) every year. Women who have a family history of breast cancer should talk to their caregiver about genetic screening. Women at a high risk of breast cancer should talk to their caregiver about having an MRI and a  mammogram every year.  The Pap test is a screening test for cervical cancer. Women should have a Pap test starting at age 21. Between ages 21 and 29, Pap tests should be repeated every 2 years. Beginning at age 30, you should have a Pap test every 3 years as long as the past 3 Pap tests have been normal. If you had a hysterectomy for a problem that was not cancer or a condition that could lead to cancer, then you no longer need Pap tests. If you are between ages 65 and 70, and you have had normal Pap tests going back 10 years, you no longer need Pap tests. If you have had past treatment for cervical cancer or a condition that could lead to cancer, you need Pap tests and screening for cancer for at least 20 years after your treatment. If Pap tests have been discontinued, risk factors (such as a new sexual partner) need to be reassessed to determine if screening should be resumed. Some women have medical problems that increase the chance of getting cervical cancer. In these cases, your caregiver may recommend more frequent screening and Pap tests.  The human papillomavirus (HPV) test is an additional test that may be used for cervical cancer screening. The HPV test looks for the virus that can cause the cell changes on the cervix. The cells collected during the Pap test can be tested for HPV. The HPV test could be used to screen women aged 30 years and older, and should be used in women of any age who have unclear Pap test results. After the age of 30, women should have HPV testing at the same frequency as a Pap test.  Colorectal cancer can be detected and often prevented. Most routine colorectal cancer screening begins at the age of 50 and continues through age 75. However, your caregiver may recommend screening at an earlier age if you have risk factors for colon cancer. On a yearly basis, your caregiver may provide home test kits to check for hidden blood in the stool. Use of a small camera at the end of a  tube, to directly examine the colon (sigmoidoscopy or colonoscopy), can detect the earliest forms of colorectal cancer. Talk to your caregiver about this at age 50, when routine screening begins. Direct examination of the colon should be repeated every 5 to 10 years through age 75, unless early forms of pre-cancerous polyps or small growths are found.  Hepatitis C blood testing is recommended for all people born from 1945 through 1965 and any individual with known risks for hepatitis C.  Practice safe sex. Use condoms and avoid high-risk sexual practices to reduce the spread of sexually transmitted infections (STIs). Sexually active women aged 25 and younger should be checked for Chlamydia, which is a common sexually transmitted infection. Older women with new or multiple partners should also be tested for Chlamydia. Testing for other STIs is recommended if you are sexually active and at increased risk.  Osteoporosis is a disease in which the   bones lose minerals and strength with aging. This can result in serious bone fractures. The risk of osteoporosis can be identified using a bone density scan. Women ages 9 and over and women at risk for fractures or osteoporosis should discuss screening with their caregivers. Ask your caregiver whether you should be taking a calcium supplement or vitamin D to reduce the rate of osteoporosis.  Menopause can be associated with physical symptoms and risks. Hormone replacement therapy is available to decrease symptoms and risks. You should talk to your caregiver about whether hormone replacement therapy is right for you.  Use sunscreen with a sun protection factor (SPF) of 30 or greater. Apply sunscreen liberally and repeatedly throughout the day. You should seek shade when your shadow is shorter than you. Protect yourself by wearing long sleeves, pants, a wide-brimmed hat, and sunglasses year round, whenever you are outdoors.  Notify your caregiver of new moles or  changes in moles, especially if there is a change in shape or color. Also notify your caregiver if a mole is larger than the size of a pencil eraser.  Stay current with your immunizations. Document Released: 02/23/2011 Document Revised: 11/02/2011 Document Reviewed: 02/23/2011 [facility] Patient Information 2014 [place], [place]. Sciatica with Rehab The sciatic nerve runs from the back down the leg and is responsible for sensation and control of the muscles in the back (posterior) side of the thigh, lower leg, and foot. Sciatica is a condition that is characterized by inflammation of this nerve.  SYMPTOMS   Signs of nerve damage, including numbness and/or weakness along the posterior side of the lower extremity.  Pain in the back of the thigh that may also travel down the leg.  Pain that worsens when sitting for long periods of time.  Occasionally, pain in the back or buttock. CAUSES  Inflammation of the sciatic nerve is the cause of sciatica. The inflammation is due to something irritating the nerve. Common sources of irritation include:  Sitting for long periods of time.  Direct trauma to the nerve.  Arthritis of the spine.  Herniated or ruptured disk.  Slipping of the vertebrae (spondylolithesis)  Pressure from soft tissues, such as muscles or ligament-like tissue (fascia). RISK INCREASES WITH:  Sports that place pressure or stress on the spine (football or weightlifting).  Poor strength and flexibility.  Failure to warm-up properly before activity.  Family history of low back pain or disk disorders.  Previous back injury or surgery.  Poor body mechanics, especially when lifting, or poor posture. PREVENTION   Warm up and stretch properly before activity.  Maintain physical fitness:  Strength, flexibility, and endurance.  Cardiovascular fitness.  Learn and use proper technique, especially with posture and lifting. When possible, have coach correct improper  technique.  Avoid activities that place stress on the spine. PROGNOSIS If treated properly, then sciatica usually resolves within 6 weeks. However, occasionally surgery is necessary.  RELATED COMPLICATIONS   Permanent nerve damage, including pain, numbness, tingle, or weakness.  Chronic back pain.  Risks of surgery: infection, bleeding, nerve damage, or damage to surrounding tissues. TREATMENT Treatment initially involves resting from any activities that aggravate your symptoms. The use of ice and medication may help reduce pain and inflammation. The use of strengthening and stretching exercises may help reduce pain with activity. These exercises may be performed at home or with referral to a therapist. A therapist may recommend further treatments, such as transcutaneous electronic nerve stimulation (TENS) or ultrasound. Your caregiver may recommend corticosteroid injections to  help reduce inflammation of the sciatic nerve. If symptoms persist despite non-surgical (conservative) treatment, then surgery may be recommended. MEDICATION  If pain medication is necessary, then nonsteroidal anti-inflammatory medications, such as aspirin and ibuprofen, or other minor pain relievers, such as acetaminophen, are often recommended.  Do not take pain medication for 7 days before surgery.  Prescription pain relievers may be given if deemed necessary by your caregiver. Use only as directed and only as much as you need.  Ointments applied to the skin may be helpful.  Corticosteroid injections may be given by your caregiver. These injections should be reserved for the most serious cases, because they may only be given a certain number of times. HEAT AND COLD  Cold treatment (icing) relieves pain and reduces inflammation. Cold treatment should be applied for 10 to 15 minutes every 2 to 3 hours for inflammation and pain and immediately after any activity that aggravates your symptoms. Use ice packs or  massage the area with a piece of ice (ice massage).  Heat treatment may be used prior to performing the stretching and strengthening activities prescribed by your caregiver, physical therapist, or athletic trainer. Use a heat pack or soak the injury in warm water. SEEK MEDICAL CARE IF:  Treatment seems to offer no benefit, or the condition worsens.  Any medications produce adverse side effects. EXERCISES  RANGE OF MOTION (ROM) AND STRETCHING EXERCISES - Sciatica Most people with sciatic will find that their symptoms worsen with either excessive bending forward (flexion) or arching at the low back (extension). The exercises which will help resolve your symptoms will focus on the opposite motion. Your physician, physical therapist or athletic trainer will help you determine which exercises will be most helpful to resolve your low back pain. Do not complete any exercises without first consulting with your clinician. Discontinue any exercises which worsen your symptoms until you speak to your clinician. If you have pain, numbness or tingling which travels down into your buttocks, leg or foot, the goal of the therapy is for these symptoms to move closer to your back and eventually resolve. Occasionally, these leg symptoms will get better, but your low back pain may worsen; this is typically an indication of progress in your rehabilitation. Be certain to be very alert to any changes in your symptoms and the activities in which you participated in the 24 hours prior to the change. Sharing this information with your clinician will allow him/her to most efficiently treat your condition. These exercises may help you when beginning to rehabilitate your injury. Your symptoms may resolve with or without further involvement from your physician, physical therapist or athletic trainer. While completing these exercises, remember:   Restoring tissue flexibility helps normal motion to return to the joints. This allows  healthier, less painful movement and activity.  An effective stretch should be held for at least 30 seconds.  A stretch should never be painful. You should only feel a gentle lengthening or release in the stretched tissue. FLEXION RANGE OF MOTION AND STRETCHING EXERCISES: STRETCH  Flexion, Single Knee to Chest   Lie on a firm bed or floor with both legs extended in front of you.  Keeping one leg in contact with the floor, bring your opposite knee to your chest. Hold your leg in place by either grabbing behind your thigh or at your knee.  Pull until you feel a gentle stretch in your low back. Hold __________ seconds.  Slowly release your grasp and repeat the exercise with  the opposite side. Repeat __________ times. Complete this exercise __________ times per day.  STRETCH  Flexion, Double Knee to Chest  Lie on a firm bed or floor with both legs extended in front of you.  Keeping one leg in contact with the floor, bring your opposite knee to your chest.  Tense your stomach muscles to support your back and then lift your other knee to your chest. Hold your legs in place by either grabbing behind your thighs or at your knees.  Pull both knees toward your chest until you feel a gentle stretch in your low back. Hold __________ seconds.  Tense your stomach muscles and slowly return one leg at a time to the floor. Repeat __________ times. Complete this exercise __________ times per day.  STRETCH  Low Trunk Rotation   Lie on a firm bed or floor. Keeping your legs in front of you, bend your knees so they are both pointed toward the ceiling and your feet are flat on the floor.  Extend your arms out to the side. This will stabilize your upper body by keeping your shoulders in contact with the floor.  Gently and slowly drop both knees together to one side until you feel a gentle stretch in your low back. Hold for __________ seconds.  Tense your stomach muscles to support your low back as you  bring your knees back to the starting position. Repeat the exercise to the other side. Repeat __________ times. Complete this exercise __________ times per day  EXTENSION RANGE OF MOTION AND FLEXIBILITY EXERCISES: STRETCH  Extension, Prone on Elbows  Lie on your stomach on the floor, a bed will be too soft. Place your palms about shoulder width apart and at the height of your head.  Place your elbows under your shoulders. If this is too painful, stack pillows under your chest.  Allow your body to relax so that your hips drop lower and make contact more completely with the floor.  Hold this position for __________ seconds.  Slowly return to lying flat on the floor. Repeat __________ times. Complete this exercise __________ times per day.  RANGE OF MOTION  Extension, Prone Press Ups  Lie on your stomach on the floor, a bed will be too soft. Place your palms about shoulder width apart and at the height of your head.  Keeping your back as relaxed as possible, slowly straighten your elbows while keeping your hips on the floor. You may adjust the placement of your hands to maximize your comfort. As you gain motion, your hands will come more underneath your shoulders.  Hold this position __________ seconds.  Slowly return to lying flat on the floor. Repeat __________ times. Complete this exercise __________ times per day.  STRENGTHENING EXERCISES - Sciatica  These exercises may help you when beginning to rehabilitate your injury. These exercises should be done near your "sweet spot." This is the neutral, low-back arch, somewhere between fully rounded and fully arched, that is your least painful position. When performed in this safe range of motion, these exercises can be used for people who have either a flexion or extension based injury. These exercises may resolve your symptoms with or without further involvement from your physician, physical therapist or athletic trainer. While completing these  exercises, remember:   Muscles can gain both the endurance and the strength needed for everyday activities through controlled exercises.  Complete these exercises as instructed by your physician, physical therapist or athletic trainer. Progress with the resistance and  repetition exercises only as your caregiver advises.  You may experience muscle soreness or fatigue, but the pain or discomfort you are trying to eliminate should never worsen during these exercises. If this pain does worsen, stop and make certain you are following the directions exactly. If the pain is still present after adjustments, discontinue the exercise until you can discuss the trouble with your clinician. STRENGTHENING Deep Abdominals, Pelvic Tilt   Lie on a firm bed or floor. Keeping your legs in front of you, bend your knees so they are both pointed toward the ceiling and your feet are flat on the floor.  Tense your lower abdominal muscles to press your low back into the floor. This motion will rotate your pelvis so that your tail bone is scooping upwards rather than pointing at your feet or into the floor.  With a gentle tension and even breathing, hold this position for __________ seconds. Repeat __________ times. Complete this exercise __________ times per day.  STRENGTHENING  Abdominals, Crunches   Lie on a firm bed or floor. Keeping your legs in front of you, bend your knees so they are both pointed toward the ceiling and your feet are flat on the floor. Cross your arms over your chest.  Slightly tip your chin down without bending your neck.  Tense your abdominals and slowly lift your trunk high enough to just clear your shoulder blades. Lifting higher can put excessive stress on the low back and does not further strengthen your abdominal muscles.  Control your return to the starting position. Repeat __________ times. Complete this exercise __________ times per day.  STRENGTHENING  Quadruped, Opposite UE/LE  Lift  Assume a hands and knees position on a firm surface. Keep your hands under your shoulders and your knees under your hips. You may place padding under your knees for comfort.  Find your neutral spine and gently tense your abdominal muscles so that you can maintain this position. Your shoulders and hips should form a rectangle that is parallel with the floor and is not twisted.  Keeping your trunk steady, lift your right hand no higher than your shoulder and then your left leg no higher than your hip. Make sure you are not holding your breath. Hold this position __________ seconds.  Continuing to keep your abdominal muscles tense and your back steady, slowly return to your starting position. Repeat with the opposite arm and leg. Repeat __________ times. Complete this exercise __________ times per day.  STRENGTHENING  Abdominals and Quadriceps, Straight Leg Raise   Lie on a firm bed or floor with both legs extended in front of you.  Keeping one leg in contact with the floor, bend the other knee so that your foot can rest flat on the floor.  Find your neutral spine, and tense your abdominal muscles to maintain your spinal position throughout the exercise.  Slowly lift your straight leg off the floor about 6 inches for a count of 15, making sure to not hold your breath.  Still keeping your neutral spine, slowly lower your leg all the way to the floor. Repeat this exercise with each leg __________ times. Complete this exercise __________ times per day. POSTURE AND BODY MECHANICS CONSIDERATIONS - Sciatica Keeping correct posture when sitting, standing or completing your activities will reduce the stress put on different body tissues, allowing injured tissues a chance to heal and limiting painful experiences. The following are general guidelines for improved posture. Your physician or physical therapist will provide you  with any instructions specific to your needs. While reading these  guidelines, remember:  The exercises prescribed by your provider will help you have the flexibility and strength to maintain correct postures.  The correct posture provides the optimal environment for your joints to work. All of your joints have less wear and tear when properly supported by a spine with good posture. This means you will experience a healthier, less painful body.  Correct posture must be practiced with all of your activities, especially prolonged sitting and standing. Correct posture is as important when doing repetitive low-stress activities (typing) as it is when doing a single heavy-load activity (lifting). RESTING POSITIONS Consider which positions are most painful for you when choosing a resting position. If you have pain with flexion-based activities (sitting, bending, stooping, squatting), choose a position that allows you to rest in a less flexed posture. You would want to avoid curling into a fetal position on your side. If your pain worsens with extension-based activities (prolonged standing, working overhead), avoid resting in an extended position such as sleeping on your stomach. Most people will find more comfort when they rest with their spine in a more neutral position, neither too rounded nor too arched. Lying on a non-sagging bed on your side with a pillow between your knees, or on your back with a pillow under your knees will often provide some relief. Keep in mind, being in any one position for a prolonged period of time, no matter how correct your posture, can still lead to stiffness. PROPER SITTING POSTURE In order to minimize stress and discomfort on your spine, you must sit with correct posture Sitting with good posture should be effortless for a healthy body. Returning to good posture is a gradual process. Many people can work toward this most comfortably by using various supports until they have the flexibility and strength to maintain this posture on their  own. When sitting with proper posture, your ears will fall over your shoulders and your shoulders will fall over your hips. You should use the back of the chair to support your upper back. Your low back will be in a neutral position, just slightly arched. You may place a small pillow or folded towel at the base of your low back for support.  When working at a desk, create an environment that supports good, upright posture. Without extra support, muscles fatigue and lead to excessive strain on joints and other tissues. Keep these recommendations in mind: CHAIR:   A chair should be able to slide under your desk when your back makes contact with the back of the chair. This allows you to work closely.  The chair's height should allow your eyes to be level with the upper part of your monitor and your hands to be slightly lower than your elbows. BODY POSITION  Your feet should make contact with the floor. If this is not possible, use a foot rest.  Keep your ears over your shoulders. This will reduce stress on your neck and low back. INCORRECT SITTING POSTURES   If you are feeling tired and unable to assume a healthy sitting posture, do not slouch or slump. This puts excessive strain on your back tissues, causing more damage and pain. Healthier options include:  Using more support, like a lumbar pillow.  Switching tasks to something that requires you to be upright or walking.  Talking a brief walk.  Lying down to rest in a neutral-spine position. PROLONGED STANDING WHILE SLIGHTLY LEANING FORWARD  When completing a task that requires you to lean forward while standing in one place for a long time, place either foot up on a stationary 2-4 inch high object to help maintain the best posture. When both feet are on the ground, the low back tends to lose its slight inward curve. If this curve flattens (or becomes too large), then the back and your other joints will experience too much stress, fatigue more  quickly and can cause pain.  CORRECT STANDING POSTURES Proper standing posture should be assumed with all daily activities, even if they only take a few moments, like when brushing your teeth. As in sitting, your ears should fall over your shoulders and your shoulders should fall over your hips. You should keep a slight tension in your abdominal muscles to brace your spine. Your tailbone should point down to the ground, not behind your body, resulting in an over-extended swayback posture.  INCORRECT STANDING POSTURES  Common incorrect standing postures include a forward head, locked knees and/or an excessive swayback. WALKING Walk with an upright posture. Your ears, shoulders and hips should all line-up. PROLONGED ACTIVITY IN A FLEXED POSITION When completing a task that requires you to bend forward at your waist or lean over a low surface, try to find a way to stabilize 3 of 4 of your limbs. You can place a hand or elbow on your thigh or rest a knee on the surface you are reaching across. This will provide you more stability so that your muscles do not fatigue as quickly. By keeping your knees relaxed, or slightly bent, you will also reduce stress across your low back. CORRECT LIFTING TECHNIQUES DO :   Assume a wide stance. This will provide you more stability and the opportunity to get as close as possible to the object which you are lifting.  Tense your abdominals to brace your spine; then bend at the knees and hips. Keeping your back locked in a neutral-spine position, lift using your leg muscles. Lift with your legs, keeping your back straight.  Test the weight of unknown objects before attempting to lift them.  Try to keep your elbows locked down at your sides in order get the best strength from your shoulders when carrying an object.  Always ask for help when lifting heavy or awkward objects. INCORRECT LIFTING TECHNIQUES DO NOT:   Lock your knees when lifting, even if it is a small  object.  Bend and twist. Pivot at your feet or move your feet when needing to change directions.  Assume that you cannot safely pick up a paperclip without proper posture. Document Released: 08/10/2005 Document Revised: 11/02/2011 Document Reviewed: 11/22/2008 [facility] Patient Information 2014 [place], [place].

## 2013-04-21 NOTE — Assessment & Plan Note (Signed)
Erx for pred taper Perform stretching exercises as indicated on handout  If not better in 4 weeks, RTC for evaluation

## 2013-12-04 ENCOUNTER — Ambulatory Visit (INDEPENDENT_AMBULATORY_CARE_PROVIDER_SITE_OTHER): Payer: Managed Care, Other (non HMO) | Admitting: Physician Assistant

## 2013-12-04 ENCOUNTER — Encounter: Payer: Self-pay | Admitting: Physician Assistant

## 2013-12-04 VITALS — BP 119/82 | HR 83 | Temp 98.5°F | Resp 16 | Ht 61.0 in | Wt 115.8 lb

## 2013-12-04 DIAGNOSIS — I771 Stricture of artery: Secondary | ICD-10-CM

## 2013-12-04 NOTE — Progress Notes (Signed)
Patient presents to clinic today c/o skin discoloration of her LLE that has been present over the past couple of weeks.  Denies swelling but states her leg feels "tight".  Denies recent surgery, travel or prolonged immobilization.  Was seen at an UC but they did not have the capability to obtain and ultrasound.  Patient endorses some thigh pain over the past day.  States is is improved with lying down.  States discoloration resolves with lying down as well.  Denies hx of rheumatologic or autoimmune illness.  No past medical history on file.  Current Outpatient Prescriptions on File Prior to Visit  Medication Sig Dispense Refill  . VIORELE 0.15-0.02/0.01 MG (21/5) tablet TAKE 1 TABLET BY MOUTH EVERY DAY AS DIRECTED  28 tablet  0   No current facility-administered medications on file prior to visit.    Allergies  Allergen Reactions  . Augmentin [Amoxicillin-Pot Clavulanate] Swelling  . Ceclor [Cefaclor]     Family History  Problem Relation Age of Onset  . Hypertension Mother   . Cancer Neg Hx   . Diabetes Neg Hx   . Stroke Neg Hx     History   Social History  . Marital Status: Single    Spouse Name: N/A    Number of Children: N/A  . Years of Education: N/A   Social History Main Topics  . Smoking status: Never Smoker   . Smokeless tobacco: None  . Alcohol Use: No  . Drug Use: No  . Sexual Activity: Yes    Birth Control/ Protection: Pill   Other Topics Concern  . None   Social History Narrative  . None   Review of Systems - See HPI.  All other ROS are negative.  BP 119/82  Pulse 83  Temp(Src) 98.5 F (36.9 C) (Oral)  Resp 16  Ht 5\' 1"  (1.549 m)  Wt 115 lb 12 oz (52.504 kg)  BMI 21.88 kg/m2  SpO2 100%  Physical Exam  Constitutional: She is oriented to person, place, and time and well-developed, well-nourished, and in no distress.  HENT:  Head: Normocephalic and atraumatic.  Right Ear: External ear normal.  Left Ear: External ear normal.  Nose: Nose normal.   Mouth/Throat: Oropharynx is clear and moist. No oropharyngeal exudate.  Eyes: Conjunctivae are normal. Pupils are equal, round, and reactive to light.  Neck: Neck supple.  Cardiovascular: Normal rate, regular rhythm, normal heart sounds and intact distal pulses.   Pulses:      Popliteal pulses are 2+ on the right side, and 2+ on the left side.       Dorsalis pedis pulses are 2+ on the right side, and 2+ on the left side.       Posterior tibial pulses are 2+ on the right side, and 2+ on the left side.  Discoloration and splotchy appearance of LLE to level of thigh, improves with elevation of leg.  No tenderness noted with palpation.  Negative for swelling.  Negative Homan sign.  Pulmonary/Chest: Effort normal and breath sounds normal. No respiratory distress. She has no wheezes. She has no rales. She exhibits no tenderness.  Neurological: She is alert and oriented to person, place, and time.  Skin: Skin is warm and dry. No rash noted.  Psychiatric: Affect normal.   Assessment/Plan: Arterial insufficiency Questionable insufficiency. Will obtain venous and arterial doppler.  Referral to vascular surgery placed.  If vascular cause is ruled out will proceed with lab workup for AI disorder and referral to Dermatology.

## 2013-12-04 NOTE — Patient Instructions (Signed)
You will be contacted by Vascular Surgery for an appointment.  You will also be contacted for your ultrasound study.  I have placed an order to assess both the arteries and veins of your left lower extremity.  Alternate tylenol and ibuprofen if needed for pain.  Continue ambulating as usual.  IF you develop chest pain or SOB, please proceed to the ER.  I will call you as soon as I have your results.

## 2013-12-04 NOTE — Progress Notes (Signed)
Pre visit review using our clinic review tool, if applicable. No additional management support is needed unless otherwise documented below in the visit note/SLS  

## 2013-12-05 ENCOUNTER — Telehealth: Payer: Self-pay | Admitting: Physician Assistant

## 2013-12-05 NOTE — Telephone Encounter (Signed)
Left message for pt to return my call.

## 2013-12-05 NOTE — Telephone Encounter (Signed)
Please call patient and make her aware there are not any emergent findings with the study that was performed.  The US technician was able to tell me there were no emergent findings like a blood clot.  WE are still waiting for the Imaging Center (had imaging at [facility]) to fax over findings.  They were [facility] to [facility].  I am out of office on Tuesdays and because she had imaging at [facility], I cannot access results from home.  Can we find out if the fax has come through to our office.  If not, we will need to contact [facility] to see if they received the study results.  Please notify patient of things mentioned above, and that I will call her tomorrow when I have the final results in my hand.

## 2013-12-05 NOTE — Telephone Encounter (Signed)
Pt left message returning my call and requests that we call her back at [phone]. Attempted to reach pt and left message to call me back.

## 2013-12-05 NOTE — Telephone Encounter (Signed)
Patient is requesting results from yesterday

## 2013-12-06 DIAGNOSIS — I771 Stricture of artery: Secondary | ICD-10-CM | POA: Insufficient documentation

## 2013-12-06 NOTE — Assessment & Plan Note (Signed)
Questionable insufficiency. Will obtain venous and arterial doppler.  Referral to vascular surgery placed.  If vascular cause is ruled out will proceed with lab workup for AI disorder and referral to Dermatology.

## 2013-12-06 NOTE — Telephone Encounter (Signed)
Notified pt and she states PA called her this morning with further instructions.

## 2013-12-06 NOTE — Telephone Encounter (Signed)
Spoke with patient concerning results.  Arterial and Venous US without acute abnormality.  Would still like her to see specialist.   If specialist sees no concern, patient to return for further lab work in office.

## 2013-12-18 ENCOUNTER — Other Ambulatory Visit: Payer: Self-pay | Admitting: Obstetrics and Gynecology

## 2013-12-28 ENCOUNTER — Encounter: Payer: Self-pay | Admitting: Vascular Surgery

## 2013-12-29 ENCOUNTER — Ambulatory Visit (INDEPENDENT_AMBULATORY_CARE_PROVIDER_SITE_OTHER): Payer: Managed Care, Other (non HMO) | Admitting: Vascular Surgery

## 2013-12-29 ENCOUNTER — Encounter: Payer: Self-pay | Admitting: Vascular Surgery

## 2013-12-29 VITALS — BP 106/75 | HR 97 | Ht 61.0 in | Wt 112.0 lb

## 2013-12-29 DIAGNOSIS — R231 Pallor: Secondary | ICD-10-CM | POA: Insufficient documentation

## 2013-12-29 DIAGNOSIS — M79609 Pain in unspecified limb: Secondary | ICD-10-CM

## 2013-12-29 DIAGNOSIS — L819 Disorder of pigmentation, unspecified: Secondary | ICD-10-CM

## 2013-12-29 NOTE — Progress Notes (Signed)
Referred by:  Piedad ClimesWilliam Cody Martin, PA-C [address] [place], [place] [address]  Reason for referral: Abnormal persistent skin change  History of Present Illness  Amy Weeks Duca is a 28 y.o. (1986-04-04) female who presents with chief complaint: left leg medial skin change.  Patient notes, left medial leg skin changes that have been persistent for 3 months.  Pt notes the pattern disappears with laying flat and compression with tight clothes.  The patient's symptoms include: mild left thigh pain, no swelling.  The patient has had no history of DVT, no history of pregnancy, no history of varicose vein, no history of venous stasis ulcers, no history of  Lymphedema and known history of skin changes in lower legs.  There is no family history of venous disorders.  The patient has never used compression stockings in the past.  History reviewed. No pertinent past medical history.  Past Surgical History  Procedure Laterality Date  . Tubes in ears      History   Social History  . Marital Status: Single    Spouse Name: N/A    Number of Children: N/A  . Years of Education: N/A   Occupational History  . Not on file.   Social History Main Topics  . Smoking status: Never Smoker   . Smokeless tobacco: Not on file  . Alcohol Use: No  . Drug Use: No  . Sexual Activity: Yes    Birth Control/ Protection: Pill   Other Topics Concern  . Not on file   Social History Narrative  . No narrative on file    Family History  Problem Relation Age of Onset  . Hypertension Mother   . Cancer Neg Hx   . Diabetes Neg Hx   . Stroke Neg Hx    Current Outpatient Prescriptions on File Prior to Visit  Medication Sig Dispense Refill  . VIORELE 0.15-0.02/0.01 MG (21/5) tablet TAKE 1 TABLET BY MOUTH EVERY DAY AS DIRECTED  28 tablet  0   No current facility-administered medications on file prior to visit.    Allergies  Allergen Reactions  . Augmentin [Amoxicillin-Pot Clavulanate] Swelling  .  Ceclor [Cefaclor]    REVIEW OF SYSTEMS:  (Positives checked otherwise negative)  CARDIOVASCULAR:  []  chest pain, []  chest pressure, []  palpitations, []  shortness of breath when laying flat, []  shortness of breath with exertion,  []  pain in feet when walking, []  pain in feet when laying flat, []  history of blood clot in veins (DVT), []  history of phlebitis, []  swelling in legs, []  varicose veins  PULMONARY:  []  productive cough, []  asthma, []  wheezing  NEUROLOGIC:  []  weakness in arms or legs, []  numbness in arms or legs, []  difficulty speaking or slurred speech, []  temporary loss of vision in one eye, []  dizziness  HEMATOLOGIC:  []  bleeding problems, []  problems with blood clotting too easily  MUSCULOSKEL:  []  joint pain, []  joint swelling  GASTROINTEST:  []  vomiting blood, []  blood in stool     GENITOURINARY:  []  burning with urination, []  blood in urine  PSYCHIATRIC:  []  history of major depression  INTEGUMENTARY:  []  rashes, []  ulcers  CONSTITUTIONAL:  []  fever, []  chills   Physical Examination Filed Vitals:   12/29/13 1324  BP: 106/75  Pulse: 97  Height: 5\' 1"  (1.549 m)  Weight: 112 lb (50.803 kg)  SpO2: 100%   Body mass index is 21.17 kg/(m^2).  General: A&O x 3, WDWN, thin  Head: [place]/AT  Ear/Nose/Throat: Hearing grossly intact, nares w/o erythema or drainage, oropharynx w/o Erythema/Exudate  Eyes: PERRLA, EOMI  Neck: Supple, no nuchal rigidity, no palpable LAD  Pulmonary: Sym exp, good air movt, CTAB, no rales, rhonchi, & wheezing  Cardiac: RRR, Nl S1, S2, no Murmurs, rubs or gallops  Vascular: Vessel Right Left  Radial Palpable Palpable  Brachial Palpable Palpable  Carotid Palpable, without bruit Palpable, without bruit  Aorta Not palpable N/A  Femoral Palpable Palpable  Popliteal Not palpable Not palpable  PT Palpable Palpable  DP Palpable Palpable   Gastrointestinal: soft, NTND, -G/R, - HSM, - masses, - CVAT B,   Musculoskeletal: M/S 5/5  throughout , Extremities without ischemic changes , L medial leg from distal thigh onto proximal calf: lace like pattern with venous engorgement  Neurologic: CN 2-12 intact , Pain and light touch intact in extremities , Motor exam as listed above  Psychiatric: Judgment intact, Mood & affect appropriate for pt's clinical situation  Dermatologic: See M/S exam for extremity exam, no rashes otherwise noted  Lymph : No Cervical, Axillary, or Inguinal lymphadenopathy   Non-Invasive Vascular Imaging  Outside BLE Venous Duplex (Date: 12/04/13):   BLE: no DVT and SVT  LLE Arterial Doppler (Date: 12/04/13):   Normal arterial study.  Outside Studies/Documentation 3 pages of outside documents were reviewed including: outpatient clinic chart.  Medical Decision Making  Amy Weeks Bouse is a 28 y.o. female who presents with: livedo reticularis of unknown etiology   The LLE vascular work-up has been started., so I will complete that work-up with a venous insufficiency duplex.  If this is negative, further workup for this patient's livedo reticularis' will be deferred to her PCP.  Thank you for allowing us to participate in this patient's care.  Leonides SakeBrian Trystan Eads, MD [facility] Office: [phone] Pager: [phone]  12/29/2013, 5:14 PM

## 2014-02-01 ENCOUNTER — Encounter: Payer: Self-pay | Admitting: Vascular Surgery

## 2014-02-02 ENCOUNTER — Ambulatory Visit (HOSPITAL_COMMUNITY)
Admission: RE | Admit: 2014-02-02 | Discharge: 2014-02-02 | Disposition: A | Discharge status: Home / Self Care | Payer: Managed Care, Other (non HMO) | Source: Ambulatory Visit | Attending: Vascular Surgery | Admitting: Vascular Surgery

## 2014-02-02 ENCOUNTER — Other Ambulatory Visit: Payer: Self-pay | Admitting: Vascular Surgery

## 2014-02-02 ENCOUNTER — Ambulatory Visit (INDEPENDENT_AMBULATORY_CARE_PROVIDER_SITE_OTHER): Payer: Managed Care, Other (non HMO) | Admitting: Vascular Surgery

## 2014-02-02 ENCOUNTER — Encounter: Payer: Self-pay | Admitting: Vascular Surgery

## 2014-02-02 VITALS — BP 108/70 | HR 77 | Resp 14 | Ht 61.0 in | Wt 110.0 lb

## 2014-02-02 DIAGNOSIS — L819 Disorder of pigmentation, unspecified: Secondary | ICD-10-CM

## 2014-02-02 DIAGNOSIS — M79609 Pain in unspecified limb: Secondary | ICD-10-CM | POA: Insufficient documentation

## 2014-02-02 DIAGNOSIS — I872 Venous insufficiency (chronic) (peripheral): Secondary | ICD-10-CM

## 2014-02-02 DIAGNOSIS — R231 Pallor: Secondary | ICD-10-CM

## 2014-02-02 NOTE — Progress Notes (Signed)
    Established Venous Insufficiency  History of Present Illness  Amy Weeks is a 28 y.o. (April 27, 1986) female who presentDamita Weeks with chief complaint: continued "rash" on left leg.  The patient's symptoms are unchanged from her previous visit.  The patient returns today for her left venous insufficiency duplex.  She denies leg swelling, bursting sensation, or any ulcers  The patient's PMH, PSH, SH, FamHx, Med, and Allergies are unchanged from 02/02/14.  On ROS today: no claudication, no CVI sx  Physical Examination  Filed Vitals:   02/02/14 1043  BP: 108/70  Pulse: 77  Resp: 14  Height: 5\' 1"  (1.549 m)  Weight: 110 lb (49.896 kg)   Body mass index is 20.8 kg/(m^2).  General: A&O x 3, WD, thin  Pulmonary: Sym exp, good air movt, CTAB, no rales, rhonchi, & wheezing  Cardiac: RRR, Nl S1, S2, no Murmurs, rubs or gallops  Vascular: Vessel Right Left  Radial Palpable Palpable  Brachial Palpable Palpable  Carotid Palpable, without bruit Palpable, without bruit  Aorta Not palpable N/A  Femoral Palpable Palpable  Popliteal Not palpable Not palpable  PT Palpable Palpable  DP Palpable Palpable   Gastrointestinal: soft, NTND, -G/R, - HSM, - masses, - CVAT B  Musculoskeletal: M/S 5/5 throughout , Extremities without ischemic changes , continued lacy erythema pattern on medial left leg  Neurologic: Pain and light touch intact in extremities , Motor exam as listed above  Non-Invasive Vascular Imaging  LLE Venous Insufficiency Duplex (Date: 02/02/2014):   No DVT and SVT, No GSV reflux, + deep venous reflux: CFV, SFV  Medical Decision Making  Amy Weeks is a 28 y.o. female who presents with: LLE chronic venous insufficiency (C2), Left livedo reticularis of unknown etiology   Based on the patient's vascular studies and examination, I have offered the patient: compressive.  This patient's GSV is intact and she doesn't have extensive varicosities, so there is no indication for  further intervention.  I doubt the CVI accounts for the livedeo reticularis.  I will defer to her PCP further work-up of such.  Thank you for allowing us to participate in this patient's care.  Amy SakeBrian Isao Seltzer, MD [facility] Office: [phone] Pager: [phone]  02/02/2014, 11:07 AM

## 2014-05-07 ENCOUNTER — Other Ambulatory Visit: Payer: Self-pay | Admitting: Obstetrics and Gynecology

## 2014-05-07 DIAGNOSIS — N6001 Solitary cyst of right breast: Secondary | ICD-10-CM

## 2014-05-10 ENCOUNTER — Ambulatory Visit
Admission: RE | Admit: 2014-05-10 | Discharge: 2014-05-10 | Disposition: A | Discharge status: Home / Self Care | Payer: Managed Care, Other (non HMO) | Source: Ambulatory Visit | Attending: Obstetrics and Gynecology | Admitting: Obstetrics and Gynecology

## 2014-05-10 DIAGNOSIS — N6001 Solitary cyst of right breast: Secondary | ICD-10-CM

## 2014-12-27 ENCOUNTER — Other Ambulatory Visit (HOSPITAL_COMMUNITY): Payer: Self-pay | Admitting: Obstetrics and Gynecology

## 2014-12-28 LAB — CYTOLOGY - PAP

## 2015-03-04 ENCOUNTER — Telehealth: Payer: Self-pay | Admitting: Physician Assistant

## 2015-03-04 NOTE — Telephone Encounter (Signed)
[facility] is [facility].  Please advise.

## 2015-03-04 NOTE — Telephone Encounter (Signed)
That is fine with me.

## 2015-03-05 ENCOUNTER — Ambulatory Visit: Payer: Managed Care, Other (non HMO) | Admitting: Internal Medicine

## 2015-03-05 NOTE — Telephone Encounter (Signed)
[facility] leaves the office on 7/13

## 2015-03-06 ENCOUNTER — Ambulatory Visit (INDEPENDENT_AMBULATORY_CARE_PROVIDER_SITE_OTHER): Payer: Managed Care, Other (non HMO) | Admitting: Internal Medicine

## 2015-03-06 ENCOUNTER — Encounter: Payer: Self-pay | Admitting: Internal Medicine

## 2015-03-06 VITALS — BP 118/74 | HR 70 | Temp 97.9°F | Resp 20 | Wt 118.0 lb

## 2015-03-06 DIAGNOSIS — J310 Chronic rhinitis: Secondary | ICD-10-CM | POA: Diagnosis not present

## 2015-03-06 NOTE — Patient Instructions (Signed)

## 2015-03-06 NOTE — Progress Notes (Signed)
Pre visit review using our clinic review tool, if applicable. No additional management support is needed unless otherwise documented below in the visit note. 

## 2015-03-06 NOTE — Progress Notes (Signed)
   Subjective:    Patient ID: Amy Weeks, female    DOB: [birth year], 29 y.o.   MRN: [id number]  HPI  Her symptoms began several months ago as pressure over the bridge of the nose. This was also associated with some blurring of her vision intermittently. The pressure sensation has progressed with a sensation of bruising. She's also at times had diffuse pressure over her head.  She does have night sweats and postnasal drainage. She's also had some discomfort in her  right ear without associated discharge. Her last ophthalmologic exam was in January of this year and was negative.  Review of Systems Frontal headache, facial pain , nasal purulence, dental pain,or sore throat denied. No fever or chills . Extrinsic symptoms of itchy, watery eyes, sneezing, or angioedema are denied. There is no significant cough, sputum production, wheezing,or  paroxysmal nocturnal dyspnea.    Objective:   Physical Exam  General appearance:Adequately nourished; no acute distress or increased work of breathing is present.    Lymphatic: No  lymphadenopathy about the head, neck, or axilla .  Eyes: No conjunctival inflammation or lid edema is present. There is no scleral icterus. Extraocular motion and vision intact to confrontation.  Ears:  External ear exam shows no significant lesions or deformities.  Otoscopic examination reveals clear canals, tympanic membranes are intact bilaterally without bulging, retraction, inflammation or discharge.  Nose:  External nasal examination shows no deformity or inflammation. Nasal mucosa are severely erythematous without lesions or exudates No septal dislocation or deviation.No obstruction to airflow.   Oral exam: Dental hygiene is good; lips and gums are healthy appearing.There is no oropharyngeal erythema or exudate .  Neck:  No deformities, thyromegaly, masses, or tenderness noted.   Supple with full range of motion without pain.   Heart:  Normal rate and regular  rhythm. S1 and S2 normal without gallop, murmur, click, rub or other extra sounds.   Lungs:Chest clear to auscultation; no wheezes, rhonchi,rales ,or rubs present.  Extremities:  No cyanosis, edema, or clubbing  noted    Skin: Warm & dry w/o tenting or jaundice. No significant lesions or rash.        Assessment & Plan:  #1 nonallergic rhinitis  Plan: See AVS recommendations. If symptoms persist CT of the sinuses will be pursued. If blurred vision persists; ophthalmologic evaluation recommended.

## 2015-03-11 ENCOUNTER — Telehealth: Payer: Self-pay | Admitting: Physician Assistant

## 2015-03-11 ENCOUNTER — Other Ambulatory Visit: Payer: Self-pay | Admitting: Internal Medicine

## 2015-03-11 ENCOUNTER — Other Ambulatory Visit: Payer: Self-pay | Admitting: Emergency Medicine

## 2015-03-11 MED ORDER — CLARITHROMYCIN ER 500 MG PO TB24
1000.0000 mg | ORAL_TABLET | Freq: Every day | ORAL | Status: DC
Start: 2015-03-11 — End: 2020-05-07

## 2015-03-11 MED ORDER — CLARITHROMYCIN ER 500 MG PO TB24: 1000.0000 mg | ORAL_TABLET | Freq: Every day | ORAL | Status: DC

## 2015-03-11 NOTE — Telephone Encounter (Signed)
Biaxin XL 1000 mg qd after a meal; can not be taken nif risk of pregnancy

## 2015-03-11 NOTE — Telephone Encounter (Signed)
Verified [facility] with [facility], informed pt antibiotic was sent in.

## 2015-03-11 NOTE — Telephone Encounter (Signed)
States she seen Dr. Alwyn RenHopper last week for sinus infection.  She states that she has gotten no relief from the Flonase and zyrtec.  She is requesting an [facility] to [facility] [address] [facility] [place].

## 2015-03-11 NOTE — Telephone Encounter (Signed)
Please advise 

## 2015-03-22 ENCOUNTER — Ambulatory Visit: Payer: Managed Care, Other (non HMO) | Admitting: Family

## 2015-08-23 ENCOUNTER — Encounter: Payer: Self-pay | Admitting: Emergency Medicine

## 2015-08-23 ENCOUNTER — Emergency Department
Admission: EM | Admit: 2015-08-23 | Discharge: 2015-08-23 | Disposition: A | Discharge status: Home / Self Care | Payer: Managed Care, Other (non HMO) | Source: Home / Self Care | Attending: Family Medicine | Admitting: Family Medicine

## 2015-08-23 DIAGNOSIS — L298 Other pruritus: Secondary | ICD-10-CM | POA: Diagnosis not present

## 2015-08-23 DIAGNOSIS — T7840XA Allergy, unspecified, initial encounter: Secondary | ICD-10-CM

## 2015-08-23 MED ORDER — TRIAMCINOLONE ACETONIDE 0.1 % EX CREA: 1.0000 "application " | TOPICAL_CREAM | Freq: Two times a day (BID) | CUTANEOUS | Status: DC

## 2015-08-23 MED ORDER — HYDROXYZINE HCL 25 MG PO TABS: 25.0000 mg | ORAL_TABLET | Freq: Four times a day (QID) | ORAL | Status: DC

## 2015-08-23 MED ORDER — PREDNISONE 20 MG PO TABS: ORAL_TABLET | ORAL | Status: DC

## 2015-08-23 MED ORDER — TRIAMCINOLONE ACETONIDE 40 MG/ML IJ SUSP
40.0000 mg | Freq: Once | INTRAMUSCULAR | Status: AC
  Administered 2015-08-23: 40 mg via INTRAMUSCULAR

## 2015-08-23 NOTE — Discharge Instructions (Signed)
Today you were given Kenalog 40mg  shot, which is a steroid, to help with inflammation and itching.  You may start your oral prednisone tomorrow morning with breakfast. In the meantime, you may start the atarax and use the topical triamcinolone cream to help with itching.  You may also take acetaminophen and ibuprofen for pain.

## 2015-08-23 NOTE — ED Notes (Signed)
Reports rash on neck and trunk starting 3 days ago. Itches.

## 2015-08-23 NOTE — ED Provider Notes (Signed)
CSN: [id number]     Arrival date & time 08/23/15  1502 History   First MD Initiated Contact with Patient 08/23/15 1542     Chief Complaint  Patient presents with  . Rash   (Consider location/radiation/quality/duration/timing/severity/associated sxs/prior Treatment) HPI Pt is a 29yo female presenting to [facility] with c/o 3 day hx of sudden onset, gradually worsening burning itching erythematous rash that started on the back of her neck and is spreading to the top of her shoulders, behind her ears, and along her hairline.  She has tried benadryl and hydrocortisone cream with no relief. She reports being on Flagyl about 1-2 weeks ago.  She also notes she had her hair dyed about 8 days ago.  Pt unsure if they used a different chemical but she is pressure sure she has washed the clothes she was wearing that day.  No other known exposures to new soaps, lotions or food. No exposure to others with similar rash.  She did received the varicella vaccine.  Denies fever, chills, n/v/d.    History reviewed. No pertinent past medical history. Past Surgical History  Procedure Laterality Date  . Tubes in ears     Family History  Problem Relation Age of Onset  . Hypertension Mother   . Cancer Neg Hx   . Diabetes Neg Hx   . Stroke Neg Hx    Social History  Substance Use Topics  . Smoking status: Never Smoker   . Smokeless tobacco: Never Used  . Alcohol Use: No   OB History    No data available     Review of Systems  Constitutional: Negative for fever and chills.  Respiratory: Negative for chest tightness, shortness of breath, wheezing and stridor.   Cardiovascular: Negative for chest pain and palpitations.  Gastrointestinal: Negative for nausea, vomiting, abdominal pain and diarrhea.  Skin: Positive for color change and rash. Negative for pallor and wound.    Allergies  Augmentin and Ceclor  Home Medications   Prior to Admission medications   Medication Sig Start Date End Date Taking?  Authorizing Provider  clarithromycin (BIAXIN XL) 500 MG 24 hr tablet Take 2 tablets (1,000 mg total) by mouth daily. After meal. 03/11/15   Pecola LawlessWilliam F Hopper, MD  hydrOXYzine (ATARAX/VISTARIL) 25 MG tablet Take 1 tablet (25 mg total) by mouth every 6 (six) hours. 08/23/15   Junius FinnerErin O'Malley, PA-C  predniSONE (DELTASONE) 20 MG tablet 3 tabs po day one, then 2 po daily x 4 days 08/23/15   Junius FinnerErin O'Malley, PA-C  triamcinolone cream (KENALOG) 0.1 % Apply 1 application topically 2 (two) times daily. 08/23/15   Junius FinnerErin O'Malley, PA-C  VIORELE 0.15-0.02/0.01 MG (21/5) tablet TAKE 1 TABLET BY MOUTH EVERY DAY AS DIRECTED 12/02/11   Dara Lordsimothy P Fontaine, MD   Meds Ordered and Administered this Visit   Medications  triamcinolone acetonide (KENALOG-40) injection 40 mg (not administered)    BP 97/65 mmHg  Pulse 76  Temp(Src) 97.8 F (36.6 C) (Oral)  Resp 16  Ht 5\' 1"  (1.549 m)  Wt 114 lb (51.71 kg)  BMI 21.55 kg/m2  SpO2 100% No data found.   Physical Exam  Constitutional: She is oriented to person, place, and time. She appears well-developed and well-nourished.  HENT:  Head: Normocephalic and atraumatic.  Eyes: EOM are normal.  Neck: Normal range of motion.  Cardiovascular: Normal rate.   Pulmonary/Chest: Effort normal. No respiratory distress. She has no wheezes.  Musculoskeletal: Normal range of motion.  Neurological: She is alert and  oriented to person, place, and time.  Skin: Skin is warm and dry. Rash noted. There is erythema.  Diffuse erythematous maculopapular rash on back of neck along hairline, behind ears, and wrapping around anterior neck.  No induration or fluctuance. No bleeding or discharge. Mildly tender to touch  Psychiatric: She has a normal mood and affect. Her behavior is normal.  Nursing note and vitals reviewed.   ED Course  Procedures (including critical care time)  Labs Review Labs Reviewed - No data to display  Imaging Review No results found.    MDM   1. Pruritic  erythematous rash   2. Allergic reaction, initial encounter     Pt presenting to [facility] with rash c/w contact dermatitis vs localized allergic reaction. Possibly due to recent hair dye used about 1 week ago.   No evidence of anaphylaxis.    Tx in UC: Kenalog  IM  Rx: prednisone PO, triamcinolone, and atarax   Encouraged to f/u with PCP in 2-3 days if not improving, sooner if worsening. Patient verbalized understanding and agreement with treatment plan.     Junius Finner, PA-C 08/23/15 [phone]

## 2016-02-12 ENCOUNTER — Emergency Department
Admission: EM | Admit: 2016-02-12 | Discharge: 2016-02-12 | Disposition: A | Discharge status: Home / Self Care | Payer: Managed Care, Other (non HMO) | Source: Home / Self Care | Attending: Family Medicine | Admitting: Family Medicine

## 2016-02-12 ENCOUNTER — Encounter: Payer: Self-pay | Admitting: Emergency Medicine

## 2016-02-12 DIAGNOSIS — T7840XA Allergy, unspecified, initial encounter: Secondary | ICD-10-CM | POA: Diagnosis not present

## 2016-02-12 DIAGNOSIS — L259 Unspecified contact dermatitis, unspecified cause: Secondary | ICD-10-CM | POA: Diagnosis not present

## 2016-02-12 MED ORDER — PREDNISONE 20 MG PO TABS: ORAL_TABLET | ORAL | Status: DC

## 2016-02-12 MED ORDER — TRIAMCINOLONE ACETONIDE 40 MG/ML IJ SUSP
40.0000 mg | Freq: Once | INTRAMUSCULAR | Status: AC
  Administered 2016-02-12: 40 mg via INTRAMUSCULAR

## 2016-02-12 NOTE — Discharge Instructions (Signed)
You were given a shot of Kenalog (a steroid) today to help with itching and swelling from a likely allergic reaction.  You have been prescribed 3 days of prednisone, an oral steroid.  You may start this medication tomorrow with breakfast.    You may also try non-drowsy, over the counter Claritin (generic is okay too) to help with itching and swelling.

## 2016-02-12 NOTE — ED Notes (Signed)
Had her hair dyed 10 days ago said here scalp is starting to itch and behind her ears, lip was swelling a little yesterday, she took a benadryl.

## 2016-02-12 NOTE — ED Provider Notes (Signed)
CSN: [id number]     Arrival date & time 02/12/16  [id number] History   First MD Initiated Contact with Patient 02/12/16 [phone]     Chief Complaint  Patient presents with  . Allergic Reaction   (Consider location/radiation/quality/duration/timing/severity/associated sxs/prior Treatment) HPI Amy Weeks is a 30 y.o. female presenting to UC with c/o mildly itchy scalp and redness behind Right ear and along posterior hairline, associated mild intermittent lower lip swelling that started yesterday but believes it is due to a hair dye her cousin used 10 days ago. She tried benadryl with minima relief. She had a similar reaction to hair dye a few months ago and just realized her cousin used the same dye she was allergic to a few months ago.  Denies new soaps, lotions, medications, or food.  She notes she is going out of town for a bachelorette  weekend and would like to get a shot of Kenalog, which she had last time, before the rash spreads even more.  Denies fever, n/v, difficulty breathing or swallowing.   History reviewed. No pertinent past medical history. Past Surgical History  Procedure Laterality Date  . Tubes in ears     Family History  Problem Relation Age of Onset  . Hypertension Mother   . Cancer Neg Hx   . Diabetes Neg Hx   . Stroke Neg Hx    Social History  Substance Use Topics  . Smoking status: Never Smoker   . Smokeless tobacco: Never Used  . Alcohol Use: No   OB History    No data available     Review of Systems  Constitutional: Negative for fever and chills.  HENT: Negative for trouble swallowing and voice change.   Respiratory: Negative for shortness of breath, wheezing and stridor.   Skin: Positive for color change and rash.    Allergies  Augmentin and Ceclor  Home Medications   Prior to Admission medications   Medication Sig Start Date End Date Taking? Authorizing Provider  clarithromycin (BIAXIN XL) 500 MG 24 hr tablet Take 2 tablets (1,000 mg total) by mouth  daily. After meal. 03/11/15   Pecola LawlessWilliam F Hopper, MD  hydrOXYzine (ATARAX/VISTARIL) 25 MG tablet Take 1 tablet (25 mg total) by mouth every 6 (six) hours. 08/23/15   Junius FinnerErin O'Malley, PA-C  predniSONE (DELTASONE) 20 MG tablet 3 tabs po day one, then 2 po daily x 4 days 02/12/16   Junius FinnerErin O'Malley, PA-C  triamcinolone cream (KENALOG) 0.1 % Apply 1 application topically 2 (two) times daily. 08/23/15   Junius FinnerErin O'Malley, PA-C  VIORELE 0.15-0.02/0.01 MG (21/5) tablet TAKE 1 TABLET BY MOUTH EVERY DAY AS DIRECTED 12/02/11   Dara Lordsimothy P Fontaine, MD   Meds Ordered and Administered this Visit   Medications  triamcinolone acetonide (KENALOG-40) injection 40 mg (40 mg Intramuscular Given 02/12/16 0943)    BP 109/74 mmHg  Pulse 63  Temp(Src) 98.1 F (36.7 C) (Oral)  Ht 5\' 1"  (1.549 m)  Wt 113 lb (51.256 kg)  BMI 21.36 kg/m2  SpO2 100% No data found.   Physical Exam  Constitutional: She is oriented to person, place, and time. She appears well-developed and well-nourished.  HENT:  Head: Normocephalic and atraumatic.  Eyes: EOM are normal.  Neck: Normal range of motion.  Cardiovascular: Normal rate, regular rhythm and normal heart sounds.   Pulmonary/Chest: Effort normal and breath sounds normal. No respiratory distress. She has no wheezes. She has no rales.  Musculoskeletal: Normal range of motion.  Neurological: She is alert and  oriented to person, place, and time.  Skin: Skin is warm and dry. Rash noted. There is erythema.  Posterior scalp- 2cm area of erythema. Non-tender. No induration or fluctuance.  Posterior Right ear: 0.5cm area of erythema, non-tender. No induration or fluctuance.   Psychiatric: She has a normal mood and affect. Her behavior is normal.  Nursing note and vitals reviewed.   ED Course  Procedures (including critical care time)  Labs Review Labs Reviewed - No data to display  Imaging Review No results found.   MDM   1. Allergic reaction, initial encounter   2. Contact  dermatitis    Exam c/w allergic reaction. Medical records reviewed, pt was seen by myself for same 08/23/15.  Rash appears similar to prior rash. Kenalog and Prednisone given last time did help significantly. Will give same treatment.  Tx in UC: Kenalog  IM  Rx: Prednisone  She may try OTC Claritin (or generic) as she said Zytrec made her feel bad.    F/u with PCP in 1 week for recheck of symptoms if not improving. Strongly encouraged pt to read product labels next time she dyes her hair to make sure she is not using same chemicals as last 2 times she had reaction. Patient verbalized understanding and agreement with treatment plan.     Junius Finner, PA-C 02/12/16 1009

## 2017-08-06 ENCOUNTER — Other Ambulatory Visit: Payer: Self-pay

## 2017-08-06 ENCOUNTER — Encounter: Payer: Self-pay | Admitting: Emergency Medicine

## 2017-08-06 ENCOUNTER — Emergency Department
Admission: EM | Admit: 2017-08-06 | Discharge: 2017-08-06 | Disposition: A | Discharge status: Home / Self Care | Payer: Commercial Managed Care - PPO | Source: Home / Self Care | Attending: Family Medicine | Admitting: Family Medicine

## 2017-08-06 DIAGNOSIS — J069 Acute upper respiratory infection, unspecified: Secondary | ICD-10-CM

## 2017-08-06 DIAGNOSIS — J029 Acute pharyngitis, unspecified: Secondary | ICD-10-CM | POA: Diagnosis not present

## 2017-08-06 DIAGNOSIS — R000 Tachycardia, unspecified: Secondary | ICD-10-CM

## 2017-08-06 LAB — POCT RAPID STREP A (OFFICE): Rapid Strep A Screen: NEGATIVE

## 2017-08-06 MED ORDER — AZITHROMYCIN 250 MG PO TABS: 250.0000 mg | ORAL_TABLET | Freq: Every day | ORAL | 0 refills | Status: DC

## 2017-08-06 MED ORDER — IBUPROFEN 400 MG PO TABS
400.0000 mg | ORAL_TABLET | Freq: Once | ORAL | Status: AC
  Administered 2017-08-06: 400 mg via ORAL

## 2017-08-06 MED ORDER — BENZOCAINE-MENTHOL 15-10 MG MT LOZG: 1.0000 | LOZENGE | Freq: Three times a day (TID) | OROMUCOSAL | 0 refills | Status: DC | PRN

## 2017-08-06 NOTE — ED Triage Notes (Signed)
Patient reports not feeling well for 2 weeks; 3 days ago began having sore throat; states concerned it may be Strep. No OTC today.

## 2017-08-06 NOTE — Discharge Instructions (Signed)
°  You may take 500mg acetaminophen every 4-6 hours or in combination with ibuprofen 400-600mg every 6-8 hours as needed for pain, inflammation, and fever. ° °Be sure to drink at least eight 8oz glasses of water to stay well hydrated and get at least 8 hours of sleep at night, preferably more while sick.  ° °

## 2017-08-06 NOTE — ED Provider Notes (Signed)
[name]    CSN: [id number] Arrival date & time: 08/06/17  1013     History   Chief Complaint Chief Complaint  Patient presents with  . Sore Throat  . Fever    HPI Amy Weeks is a 31 y.o. female.   HPI  Amy Weeks is a 31 y.o. female presenting to UC with c/o 2 weeks of not feeling well with body aches, fatigue, congestion, minimal cough and now 3 days of sore throat and Left ear pain.  Hx of strep throat. Denies known sick contacts or recent travel. Pt unsure if she has had a fever but did have one in triage- 100.2*F.  Denies chest pain or SOB. No medication taken PTA.   History reviewed. No pertinent past medical history.  Patient Active Problem List   Diagnosis Date Noted  . Pain in limb 02/02/2014  . Chronic venous insufficiency 02/02/2014  . Discoloration of skin 12/29/2013  . 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 Arterial insufficiency (HCC) 12/06/2013  . 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 ACUTE SEROUS OTITIS MEDIA 02/09/2011  . 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 CERVICAL LYMPHADENOPATHY 02/09/2011    Past Surgical History:  Procedure Laterality Date  . tubes in ears      OB History    No data available       Home Medications    Prior to Admission medications   Medication Sig Start Date End Date Taking? Authorizing Provider  azithromycin (ZITHROMAX) 250 MG tablet Take 1 tablet (250 mg total) by mouth daily. Take first 2 tablets together, then 1 every day until finished. 08/06/17   Waylan Rocher O, PA-C  Benzocaine-Menthol 15-10 MG LOZG Use as directed 1 lozenge in the mouth or throat 3 (three) times daily as needed. 08/06/17   Lurene Shadow, PA-C  clarithromycin (BIAXIN XL) 500 MG 24 hr tablet Take 2 tablets (1,000 mg total) by mouth daily. After meal. 03/11/15   Pecola Lawless, MD  hydrOXYzine (ATARAX/VISTARIL) 25 MG tablet Take 1 tablet (25 mg total) by mouth every 6 (six) hours. 08/23/15   Lurene Shadow, PA-C    predniSONE (DELTASONE) 20 MG tablet 3 tabs po day one, then 2 po daily x 4 days 02/12/16   Lurene Shadow, PA-C  triamcinolone cream (KENALOG) 0.1 % Apply 1 application topically 2 (two) times daily. 08/23/15   Amear Strojny O, PA-C  VIORELE 0.15-0.02/0.01 MG (21/5) tablet TAKE 1 TABLET BY MOUTH EVERY DAY AS DIRECTED 12/02/11   Fontaine, Nadyne Coombes, MD    Family History Family History  Problem Relation Age of Onset  . Hypertension Mother   . Cancer Neg Hx   . Diabetes Neg Hx   . Stroke Neg Hx     Social History Social History   Tobacco Use  . Smoking status: Never Smoker  . Smokeless tobacco: Never Used  Substance Use Topics  . Alcohol use: No  . Drug use: No     Allergies   Augmentin [amoxicillin-pot clavulanate] and Ceclor [cefaclor]   Review of Systems Review of Systems  Constitutional: Positive for fever. Negative for chills.  HENT: Positive for congestion, ear pain ( Left) and sore throat. Negative for trouble swallowing and voice change.   Respiratory: Positive for cough ( minimal). Negative for shortness of breath.   Cardiovascular: Negative for chest pain and palpitations.  Gastrointestinal: Negative for abdominal pain, diarrhea, nausea and vomiting.  Musculoskeletal: Negative for arthralgias, back pain and myalgias.  Skin: Negative for  rash.  Neurological: Positive for headaches. Negative for dizziness and light-headedness.     Physical Exam Triage Vital Signs ED Triage Vitals  Enc Vitals Group     BP 08/06/17 1040 114/77     Pulse Rate 08/06/17 1040 (!) 128     Resp 08/06/17 1040 16     Temp 08/06/17 1040 100.2 F (37.9 C)     Temp Source 08/06/17 1040 Oral     SpO2 08/06/17 1040 99 %     Weight 08/06/17 1043 115 lb (52.2 kg)     Height 08/06/17 1043 5\' 1"  (1.549 m)     Head Circumference --      Peak Flow --      Pain Score 08/06/17 1043 3     Pain Loc --      Pain Edu? --      Excl. in GC? --    No data found.  Updated Vital Signs BP 114/77  (BP Location: Right Arm)   Pulse (!) 128   Temp 100.2 F (37.9 C) (Oral)   Resp 16   Ht 5\' 1"  (1.549 m)   Wt 115 lb (52.2 kg)   SpO2 99%   BMI 21.73 kg/m   Visual Acuity Right Eye Distance:   Left Eye Distance:   Bilateral Distance:    Right Eye Near:   Left Eye Near:    Bilateral Near:     Physical Exam  Constitutional: She is oriented to person, place, and time. She appears well-developed and well-nourished.  Non-toxic appearance. She does not appear ill. No distress.  HENT:  Head: Normocephalic and atraumatic.  Right Ear: Tympanic membrane normal.  Left Ear: Tympanic membrane is not erythematous and not bulging. A middle ear effusion is present.  Nose: Nose normal. Right sinus exhibits no maxillary sinus tenderness and no frontal sinus tenderness. Left sinus exhibits no maxillary sinus tenderness and no frontal sinus tenderness.  Mouth/Throat: Uvula is midline and mucous membranes are normal. Posterior oropharyngeal erythema present. No oropharyngeal exudate, posterior oropharyngeal edema or tonsillar abscesses.  Eyes: EOM are normal.  Neck: Normal range of motion. Neck supple.  Cardiovascular: Regular rhythm. Tachycardia present.  Pulmonary/Chest: Effort normal and breath sounds normal. No stridor. No respiratory distress. She has no wheezes. She has no rhonchi.  Musculoskeletal: Normal range of motion.  Lymphadenopathy:    She has cervical adenopathy.  Neurological: She is alert and oriented to person, place, and time.  Skin: Skin is warm and dry.  Psychiatric: She has a normal mood and affect. Her behavior is normal.  Nursing note and vitals reviewed.    UC Treatments / Results  Labs (all labs ordered are listed, but only abnormal results are displayed) Labs Reviewed  POCT RAPID STREP A (OFFICE)    EKG  EKG Interpretation None       Radiology No results found.  Procedures Procedures (including critical care time)  Medications Ordered in  UC Medications  ibuprofen (ADVIL,MOTRIN) tablet 400 mg (400 mg Oral Given 08/06/17 1037)     Initial Impression / Assessment and Plan / UC Course  I have reviewed the triage vital signs and the nursing notes.  Pertinent labs & imaging results that were available during my care of the patient were reviewed by me and considered in my medical decision making (see chart for details).     Rapid strep: Negative Due to duration of symptoms and worsening of symptoms, will cover for underlying bacterial infection Tachycardia likely related to  low grade fever and throat pain.   No evidence of tonsillar abscess Encouraged f/u with PCP in 1 week if not improving, sooner if significantly worsening.   Final Clinical Impressions(s) / UC Diagnoses   Final diagnoses:  Pharyngitis, unspecified etiology  Upper respiratory tract infection, unspecified type  Tachycardia    ED Discharge Orders        Ordered    azithromycin (ZITHROMAX) 250 MG tablet  Daily     08/06/17 1054    Benzocaine-Menthol 15-10 MG LOZG  3 times daily PRN     08/06/17 1054       Controlled Substance Prescriptions [place] Controlled Substance Registry consulted? Not Applicable   Rolla Platehelps, Candis Kabel O, PA-C 08/06/17 1116

## 2017-08-08 ENCOUNTER — Telehealth: Payer: Self-pay | Admitting: Emergency Medicine

## 2017-08-08 NOTE — Telephone Encounter (Signed)
Inquired about patient's status; encourage them to call with questions/concerns.  

## 2017-08-08 NOTE — Telephone Encounter (Signed)
Returned call; is feeling somewhat better but not completely; taking ibuprofen q 4 hours as directed. Suggested sh continue on rx and OTC regime for another 24 hours and seek help if not improved.pk

## 2020-03-25 ENCOUNTER — Encounter: Payer: Self-pay | Admitting: Gastroenterology

## 2020-05-07 ENCOUNTER — Ambulatory Visit: Payer: Managed Care, Other (non HMO) | Admitting: Gastroenterology

## 2020-05-07 ENCOUNTER — Encounter: Payer: Self-pay | Admitting: Gastroenterology

## 2020-05-07 VITALS — BP 100/70 | HR 88 | Ht 61.0 in | Wt 123.0 lb

## 2020-05-07 DIAGNOSIS — K5900 Constipation, unspecified: Secondary | ICD-10-CM | POA: Diagnosis not present

## 2020-05-07 DIAGNOSIS — R109 Unspecified abdominal pain: Secondary | ICD-10-CM | POA: Diagnosis not present

## 2020-05-07 NOTE — Patient Instructions (Signed)
If you are age 34 or older, your body mass index should be between 23-30. Your Body mass index is 23.24 kg/m. If this is out of the aforementioned range listed, please consider follow up with your Primary Care Provider.  If you are age 66 or younger, your body mass index should be between 19-25. Your Body mass index is 23.24 kg/m. If this is out of the aformentioned range listed, please consider follow up with your Primary Care Provider.   Miralax 1 capful daily in 8 ounces of liquid.  You have been scheduled for a CT scan of the abdomen and pelvis at [facility], 1st floor Radiology. You are scheduled on Tuesday 05/14/20  at 11 am. You should arrive 15 minutes prior to your appointment time for registration.  Please pick up 2 bottles of contrast from [place] at least 3 days prior to your scan. The solution may taste better if refrigerated, but do NOT add ice or any other liquid to this solution. Shake well before drinking.   Please follow the written instructions below on the day of your exam:   1) Do not eat anything after 7 am (4 hours prior to your test)   2) Drink 1 bottle of contrast @ 9 am (2 hours prior to your exam)  Remember to shake well before drinking and do NOT pour over ice.     Drink 1 bottle of contrast @ 10 am (1 hour prior to your exam)   You may take any medications as prescribed with a small amount of water, if necessary. If you take any of the following medications: METFORMIN, GLUCOPHAGE, [place], AVANDAMET, RIOMET, FORTAMET, [place] MET, JANUMET, GLUMETZA or METAGLIP, you MAY be asked to HOLD this medication 48 hours AFTER the exam.   If you have any questions regarding your exam or if you need to reschedule, you may call [facility] at [phone] between the hours of 8:00 am and 5:00 pm, Monday-Friday.

## 2020-05-07 NOTE — Progress Notes (Signed)
05/07/2020 Alvilda Mckenna [id number] 1986-01-03   HISTORY OF PRESENT ILLNESS: This is a pleasant 34 year old female who is new to our office.  Her mother, Lonia Mad, used to work in our office.  She is here today at the request of her gynecologist, Dr. Vincente Poli, for evaluation regarding left-sided abdominal pain.  She indicates that her pain is just to the left of her umbilicus.  She says that its been there for the past few months.  She says that at first it was more severe and more persistent.  She says she was seen by Dr. Vincente Poli and had an ultrasound performed with no abnormalities from their standpoint.  She says that it has improved somewhat, but is still present.  She also notes a change in her bowel habits with constipation, describing what sounds like hard rabbit pellet type stools.  She says that sometimes they are larger pieces and painful to pass.  She tried taking stool softeners, but was only taking 1 Colace per day for 3 to 4 days in a row.   History reviewed. No pertinent past medical history. Past Surgical History:  Procedure Laterality Date  . tubes in ears      reports that she has never smoked. She has never used smokeless tobacco. She reports that she does not drink alcohol and does not use drugs. family history includes Hypertension in her mother. Allergies  Allergen Reactions  . Augmentin [Amoxicillin-Pot Clavulanate] Swelling  . Ceclor [Cefaclor]       Outpatient Encounter Medications as of 05/07/2020  Medication Sig  . VIORELE 0.15-0.02/0.01 MG (21/5) tablet TAKE 1 TABLET BY MOUTH EVERY DAY AS DIRECTED  . [DISCONTINUED] azithromycin (ZITHROMAX) 250 MG tablet Take 1 tablet (250 mg total) by mouth daily. Take first 2 tablets together, then 1 every day until finished.  . [DISCONTINUED] Benzocaine-Menthol 15-10 MG LOZG Use as directed 1 lozenge in the mouth or throat 3 (three) times daily as needed.  . [DISCONTINUED] clarithromycin (BIAXIN XL) 500 MG 24 hr tablet  Take 2 tablets (1,000 mg total) by mouth daily. After meal.  . [DISCONTINUED] hydrOXYzine (ATARAX/VISTARIL) 25 MG tablet Take 1 tablet (25 mg total) by mouth every 6 (six) hours.  . [DISCONTINUED] predniSONE (DELTASONE) 20 MG tablet 3 tabs po day one, then 2 po daily x 4 days  . [DISCONTINUED] triamcinolone cream (KENALOG) 0.1 % Apply 1 application topically 2 (two) times daily.   No facility-administered encounter medications on file as of 05/07/2020.     REVIEW OF SYSTEMS  : All other systems reviewed and negative except where noted in the History of Present Illness.   PHYSICAL EXAM: BP 100/70   Pulse 88   Ht 5\' 1"  (1.549 m)   Wt 123 lb (55.8 kg)   BMI 23.24 kg/m  General: Well developed white female in no acute distress Head: Normocephalic and atraumatic Eyes:  Sclerae anicteric, conjunctiva pink. Ears: Normal auditory acuity Lungs: Clear throughout to auscultation; no W/R/R. Heart: Regular rate and rhythm; no M/R/G. Abdomen: Soft, non-distended.  BS present.  Mild left mid-abdominal TTP. Musculoskeletal: Symmetrical with no gross deformities  Skin: No lesions on visible extremities Extremities: No edema  Neurological: Alert oriented x 4, grossly non-focal Psychological:  Alert and cooperative. Normal mood and affect  ASSESSMENT AND PLAN: *34 year old female with complaints of left-sided abdominal pain that have been present for the past couple months.  Has improved somewhat, but still there.  Located in the left mid abdomen, to the left  of the umbilicus.  Also has some associated change in bowel habits with what sounds like constipation.  Question if this is all constipation related.  She saw her GYN and was cleared from their standpoint.  We discussed CT scan versus colonoscopy.  We decided to proceed with CT scan first.  I have also asked her to begin taking MiraLAX 1 capful in 8 ounces of liquid daily to see if this helps move her bowels better and in turn improves her  discomfort.   CC:  Marcelle Overlie, MD

## 2020-05-10 NOTE — Progress Notes (Signed)
Attending Physician's Attestation   I have reviewed the chart.   I agree with the Advanced Practitioner's note, impression, and recommendations with any updates as below. Agree with cross-sectional imaging and likely endoscopic evaluation thereafter should there not be findings on CT scan to explain symptoms.   Corliss Parish, MD [facility] Advanced Endoscopy Office # [phone]

## 2020-05-14 ENCOUNTER — Other Ambulatory Visit: Payer: Self-pay

## 2020-05-14 ENCOUNTER — Ambulatory Visit (INDEPENDENT_AMBULATORY_CARE_PROVIDER_SITE_OTHER): Payer: Managed Care, Other (non HMO)

## 2020-05-14 DIAGNOSIS — R109 Unspecified abdominal pain: Secondary | ICD-10-CM | POA: Diagnosis not present

## 2020-05-14 DIAGNOSIS — K5900 Constipation, unspecified: Secondary | ICD-10-CM

## 2020-05-14 MED ORDER — IOHEXOL 300 MG/ML  SOLN
100.0000 mL | Freq: Once | INTRAMUSCULAR | Status: AC | PRN
  Administered 2020-05-14: 100 mL via INTRAVENOUS

## 2020-11-15 ENCOUNTER — Other Ambulatory Visit: Payer: Self-pay

## 2020-11-15 ENCOUNTER — Encounter: Payer: Self-pay | Admitting: Emergency Medicine

## 2020-11-15 ENCOUNTER — Emergency Department
Admission: EM | Admit: 2020-11-15 | Discharge: 2020-11-15 | Disposition: A | Discharge status: Home / Self Care | Payer: Managed Care, Other (non HMO) | Source: Home / Self Care | Attending: Family Medicine | Admitting: Family Medicine

## 2020-11-15 ENCOUNTER — Emergency Department (INDEPENDENT_AMBULATORY_CARE_PROVIDER_SITE_OTHER): Payer: BC Managed Care – PPO

## 2020-11-15 DIAGNOSIS — M545 Low back pain, unspecified: Secondary | ICD-10-CM

## 2020-11-15 DIAGNOSIS — R3129 Other microscopic hematuria: Secondary | ICD-10-CM

## 2020-11-15 DIAGNOSIS — M533 Sacrococcygeal disorders, not elsewhere classified: Secondary | ICD-10-CM

## 2020-11-15 LAB — POCT URINALYSIS DIP (MANUAL ENTRY)
Bilirubin, UA: NEGATIVE
Glucose, UA: NEGATIVE mg/dL
Ketones, POC UA: NEGATIVE mg/dL
Nitrite, UA: NEGATIVE
Protein Ur, POC: NEGATIVE mg/dL
Spec Grav, UA: 1.01 (ref 1.010–1.025)
Urobilinogen, UA: 0.2 E.U./dL
pH, UA: 6.5 (ref 5.0–8.0)

## 2020-11-15 LAB — CBC WITH DIFFERENTIAL/PLATELET
Absolute Monocytes: 422 cells/uL (ref 200–950)
Monocytes Relative: 6.2 %
Neutro Abs: 4882 cells/uL (ref 1500–7800)
Platelets: 327 10*3/uL (ref 140–400)

## 2020-11-15 MED ORDER — METAXALONE 800 MG PO TABS: ORAL_TABLET | ORAL | 0 refills | Status: AC

## 2020-11-15 NOTE — Discharge Instructions (Addendum)
Read information sheet on SI joint pain. May take Tylenol (two 500mg  tabs twice daily) as needed for pain. Begin range of motion and stretching exercises as tolerated.

## 2020-11-15 NOTE — ED Provider Notes (Signed)
[facility]    CSN: [id number] Arrival date & time: 11/15/20  6222      History   Chief Complaint Chief Complaint  Patient presents with   Back Pain    HPI Amy Weeks is a 35 y.o. female.   Patient complains of lower back pain for about 2 weeks, primarily on the right.  She recalls no injury or recent change in activities.  The pain is gradually becoming worse but does not radiate.  She believes that the pain may be resulting from a poorly fitting chair at work. Her lower back hurts whenever she arises from a chair. She denies urinary symptoms but notes that her urinalysis results have always shown microscopic hematuria during the past four years, and has never been evaluated.  She denies history of kidney stones.  She denies GI symptoms but notes that her stools have been somewhat green during the past two days. She had an episode of lower back pain about two months ago that resolved spontaneously.  The history is provided by the patient.  Back Pain Location:  Sacro-iliac joint Quality:  Aching Radiates to:  Does not radiate Pain severity:  Mild Pain is:  Same all the time Onset quality:  Gradual Duration:  2 weeks Timing:  Constant Progression:  Unchanged Chronicity:  New Context: not lifting heavy objects, not MCA and not recent illness   Relieved by:  Nothing Worsened by:  Movement Ineffective treatments: lumbar pad. Associated symptoms: no abdominal pain, no abdominal swelling, no bladder incontinence, no bowel incontinence, no chest pain, no dysuria, no fever, no leg pain, no numbness, no paresthesias, no pelvic pain, no perianal numbness, no tingling, no weakness and no weight loss   Risk factors: not pregnant     History reviewed. No pertinent past medical history.  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      Home Medications    Prior to Admission medications   Medication Sig Start Date End Date Taking? Authorizing Provider  metaxalone (SKELAXIN) 800 MG tablet Take one tab PO, once or twice daily for muscle spasm 11/15/20  Yes Shamal Stracener, Tera Mater, MD  VIORELE 0.15-0.02/0.01 MG (21/5) tablet TAKE 1 TABLET BY MOUTH EVERY DAY AS DIRECTED 12/02/11   Fontaine, Nadyne Coombes, MD    Family History Family History  Problem Relation Age of Onset   Hypertension Mother    Healthy Father    Cancer Neg Hx    Diabetes Neg Hx    Stroke Neg Hx     Social History Social History   Tobacco Use   Smoking status: Never Smoker   Smokeless tobacco: Never Used  Building services engineer Use: Never used  Substance Use Topics   Alcohol use: No   Drug use: No     Allergies   Augmentin [amoxicillin-pot clavulanate] and Ceclor [cefaclor]   Review of Systems Review of Systems  Constitutional: Negative for activity change,  appetite change, chills, diaphoresis, fatigue, fever, unexpected weight change and weight loss.  HENT: Negative.   Eyes: Negative.   Respiratory: Negative.   Cardiovascular: Negative for chest pain.  Gastrointestinal: Negative for abdominal pain, blood in stool, bowel incontinence, constipation, diarrhea, nausea and vomiting.  Genitourinary: Negative for bladder incontinence, dysuria and pelvic pain.  Musculoskeletal: Positive for back pain.  Skin: Negative.   Neurological: Negative for tingling, weakness, numbness and paresthesias.  Hematological: Negative for  adenopathy.     Physical Exam Triage Vital Signs ED Triage Vitals  Enc Vitals Group     BP 11/15/20 0957 118/82     Pulse Rate 11/15/20 0957 92     Resp 11/15/20 0957 18     Temp 11/15/20 0957 98.5 F (36.9 C)     Temp Source 11/15/20 0957 Oral     SpO2 11/15/20 0957 100 %     Weight 11/15/20 0958 120 lb (54.4 kg)     Height 11/15/20 0958 5\' 1"  (1.549 m)     Head Circumference --      Peak Flow --      Pain Score 11/15/20 0958 7     Pain Loc --      Pain Edu? --      Excl. in GC? --    No data found.  Updated Vital Signs BP 118/82 (BP Location: Right Arm)    Pulse 92    Temp 98.5 F (36.9 C) (Oral)    Resp 18    Ht 5\' 1"  (1.549 m)    Wt 54.4 kg    SpO2 100%    BMI 22.67 kg/m   Visual Acuity Right Eye Distance:   Left Eye Distance:   Bilateral Distance:    Right Eye Near:   Left Eye Near:    Bilateral Near:     Physical Exam Vitals and nursing note reviewed.  Constitutional:      General: She is not in acute distress. HENT:     Head: Normocephalic.     Mouth/Throat:     Pharynx: Oropharynx is clear.  Eyes:     Conjunctiva/sclera: Conjunctivae normal.     Pupils: Pupils are equal, round, and reactive to light.  Cardiovascular:     Rate and Rhythm: Normal rate and regular rhythm.     Heart sounds: Normal heart sounds.  Pulmonary:     Breath sounds: Normal breath sounds.  Abdominal:     Palpations: Abdomen is soft.     Tenderness: There is no abdominal tenderness.  Musculoskeletal:     Cervical back: Neck supple.     Lumbar back: Tenderness present. Normal range of motion. Negative right straight leg raise test and negative left straight leg raise test.       Back:     Right lower leg: No edema.     Left lower leg: No edema.     Comments: Tenderness over right SI joint.  Negative sitting knee extension test.  Patellar and achilles reflexes normal. No leg length discrepancy noted.  Lymphadenopathy:     Cervical: No cervical adenopathy.  Skin:     General: Skin is warm and dry.     Findings: No rash.  Neurological:     Mental Status: She is alert.      UC Treatments / Results  Labs (all labs ordered are listed, but only abnormal results are displayed) Labs Reviewed  COMPLETE METABOLIC PANEL WITH GFR - Abnormal; Notable for the following components:  Result Value   CO2 17 (*)    All other components within normal limits  POCT URINALYSIS DIP (MANUAL ENTRY) - Abnormal; Notable for the following components:   Blood, UA moderate (*)    Leukocytes, UA Trace (*)    All other components within normal limits  URINE CULTURE  CBC WITH DIFFERENTIAL/PLATELET    EKG   Radiology No results found.  Procedures Procedures (including critical care time)  Medications Ordered in UC Medications - No data to display  Initial Impression / Assessment and Plan / UC Course  I have reviewed the triage vital signs and the nursing notes.  Pertinent labs & imaging results that were available during my care of the patient were reviewed by me and considered in my medical decision making (see chart for details).    CBC, CMP, and urine culture pending.   Patient has a past history of un-evaluated microscopic hematuria.  Recommend evaluation by urologist. Given SI joint treatment instructions with range of motion and stretching exercises.  Rx for Skelaxin. Followup with Dr. [facility] if back pain not improving about two weeks.    Final Clinical Impressions(s) / UC Diagnoses   Final diagnoses:  Acute bilateral low back pain without sciatica  Microscopic hematuria  Disorder of SI (sacroiliac) joint     Discharge Instructions     Read information sheet on SI joint pain. May take Tylenol (two 500mg  tabs twice daily) as needed for pain. Begin range of motion and stretching exercises as tolerated.     ED Prescriptions    Medication Sig Dispense Auth. Provider   metaxalone (SKELAXIN) 800 MG  tablet Take one tab PO, once or twice daily for muscle spasm 20 tablet , MD        Lattie Haw, MD 11/17/20 2012

## 2020-11-15 NOTE — ED Notes (Signed)
PH, UA 6.0-Not 6.5

## 2020-11-15 NOTE — ED Triage Notes (Addendum)
Low back pain x 2 weeks, Stools are green past 2 days. vaccinated

## 2020-11-16 LAB — COMPLETE METABOLIC PANEL WITH GFR
AG Ratio: 1.5 (calc) (ref 1.0–2.5)
ALT: 7 U/L (ref 6–29)
AST: 12 U/L (ref 10–30)
Albumin: 4 g/dL (ref 3.6–5.1)
Alkaline phosphatase (APISO): 40 U/L (ref 31–125)
BUN: 8 mg/dL (ref 7–25)
CO2: 17 mmol/L — ABNORMAL LOW (ref 20–32)
Calcium: 9.1 mg/dL (ref 8.6–10.2)
Chloride: 105 mmol/L (ref 98–110)
Creat: 1.09 mg/dL (ref 0.50–1.10)
GFR, Est African American: 77 mL/min/{1.73_m2} (ref >=60)
GFR, Est Non African American: 66 mL/min/{1.73_m2} (ref >=60)
Globulin: 2.7 g/dL (calc) (ref 1.9–3.7)
Glucose, Bld: 83 mg/dL (ref 65–99)
Potassium: 3.9 mmol/L (ref 3.5–5.3)
Sodium: 137 mmol/L (ref 135–146)
Total Bilirubin: 0.6 mg/dL (ref 0.2–1.2)
Total Protein: 6.7 g/dL (ref 6.1–8.1)

## 2020-11-16 LAB — CBC WITH DIFFERENTIAL/PLATELET
Basophils Absolute: 102 cells/uL (ref 0–200)
Basophils Relative: 1.5 %
Eosinophils Absolute: 27 cells/uL (ref 15–500)
Eosinophils Relative: 0.4 %
HCT: 40.8 % (ref 35.0–45.0)
Hemoglobin: 13.5 g/dL (ref 11.7–15.5)
Lymphs Abs: 1367 cells/uL (ref 850–3900)
MCH: 30.5 pg (ref 27.0–33.0)
MCHC: 33.1 g/dL (ref 32.0–36.0)
MCV: 92.1 fL (ref 80.0–100.0)
MPV: 10.3 fL (ref 7.5–12.5)
Neutrophils Relative %: 71.8 %
RBC: 4.43 10*6/uL (ref 3.80–5.10)
RDW: 12.2 % (ref 11.0–15.0)
Total Lymphocyte: 20.1 %
WBC: 6.8 10*3/uL (ref 3.8–10.8)

## 2020-11-16 LAB — URINE CULTURE
MICRO NUMBER:: 11693295
SPECIMEN QUALITY:: ADEQUATE

## 2021-09-03 IMAGING — DX DG LUMBAR SPINE COMPLETE 4+V
5 series · 5 of 5 positions shown · non-contrast
Comparison: CT abdomen pelvis dated May 14, 2020.

CLINICAL DATA: Low back pain for the past 2 weeks.  No injury.

EXAM:
LUMBAR SPINE - COMPLETE 4+ VIEW

[l-spine ap]
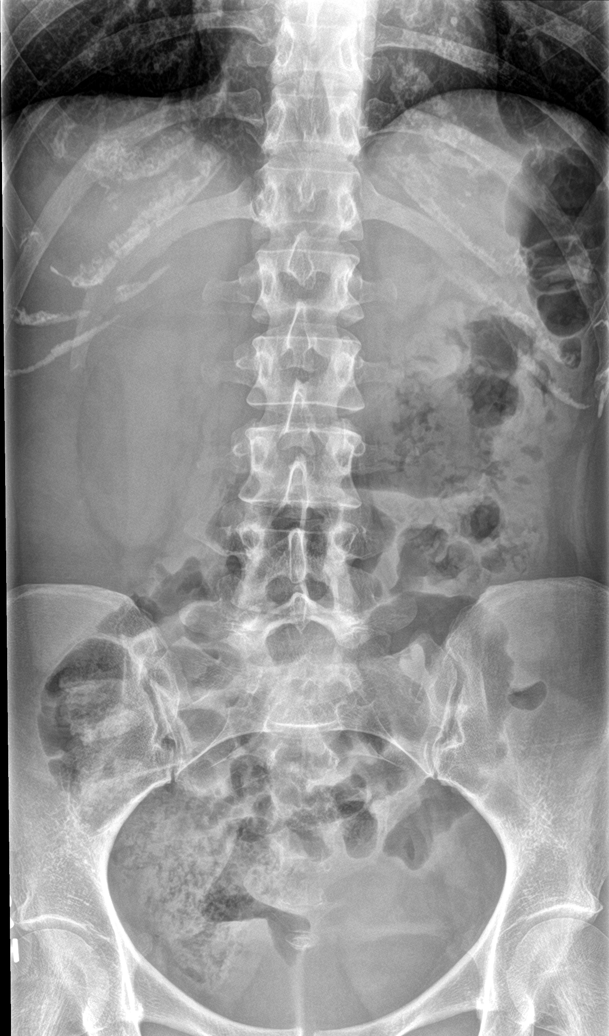

[l-spine obl (1 of 2)]
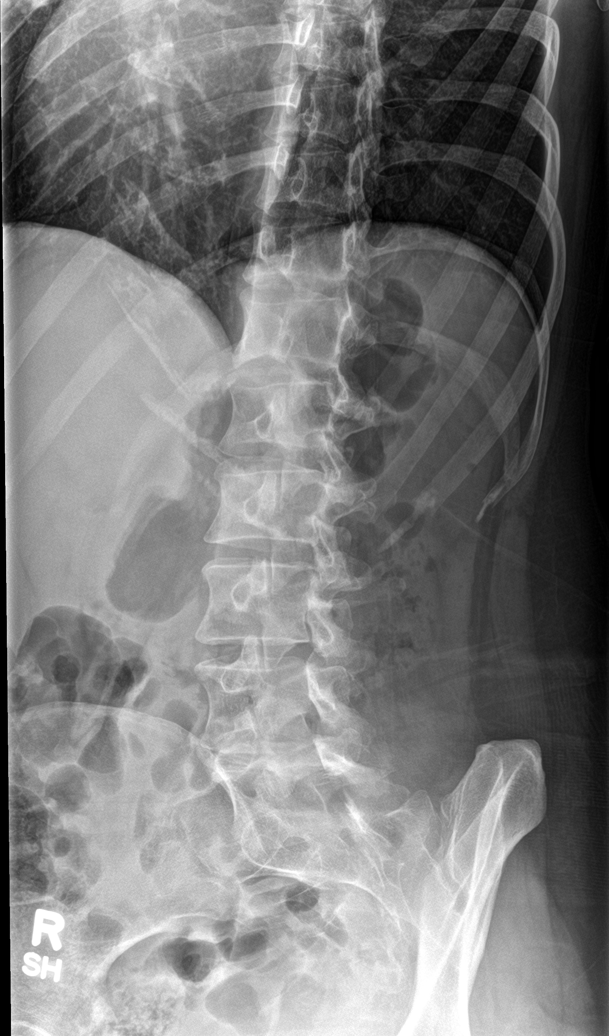

[l-spine obl (2 of 2)]
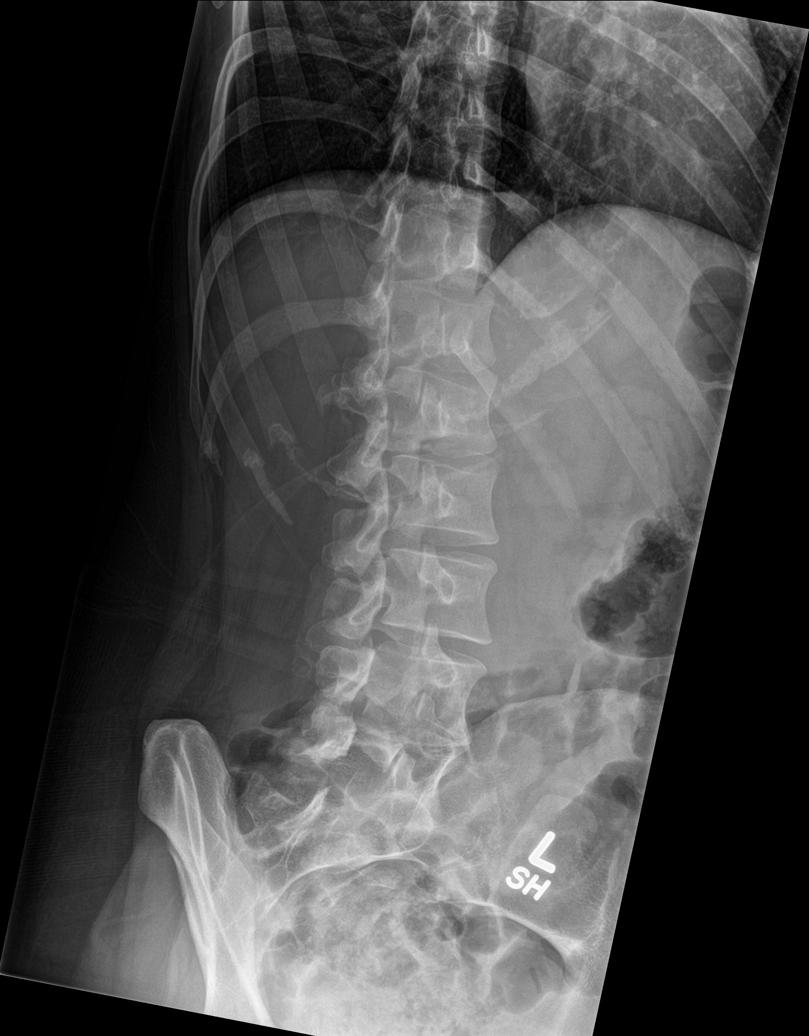

[l-spine lat]
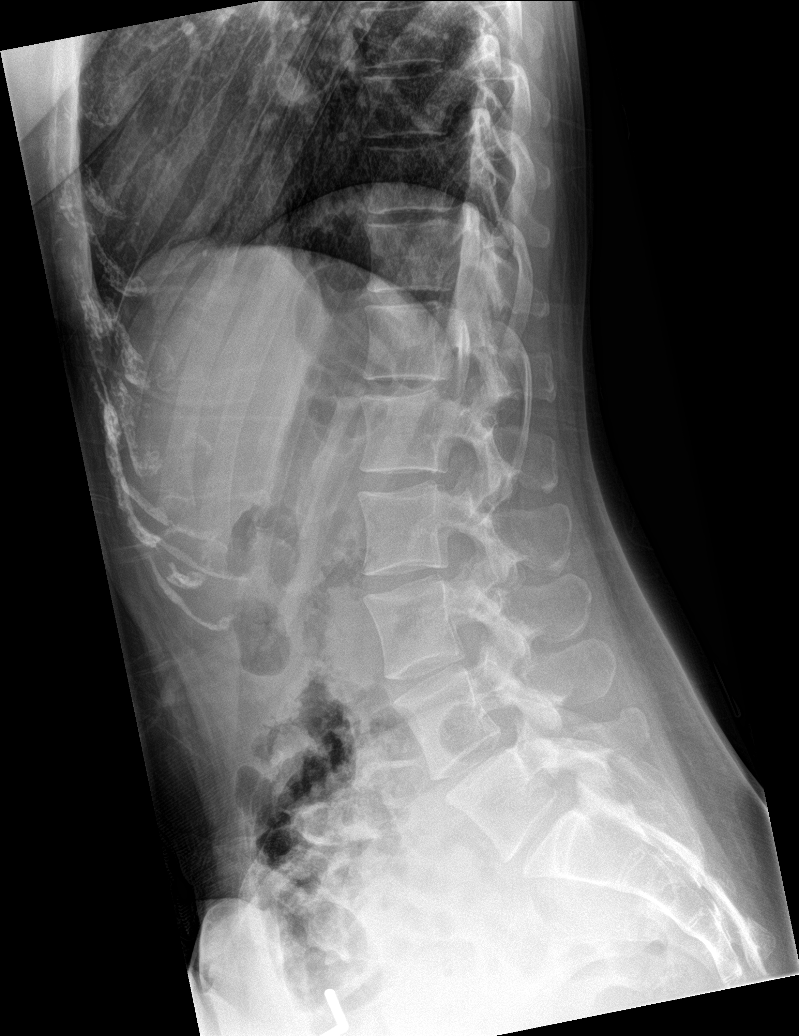

[l-spine spot]
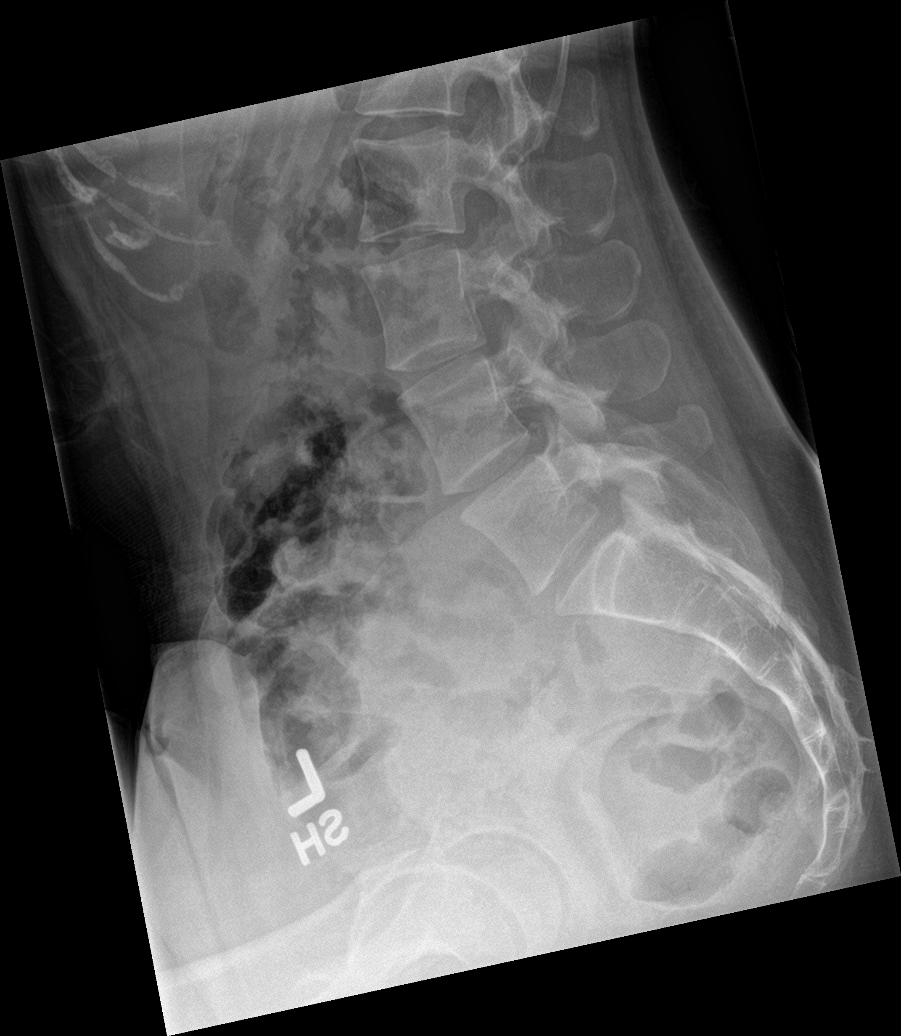

[5 of 5 positions shown; findings below may reference images not displayed]

FINDINGS: Five lumbar type vertebral bodies.

No acute fracture or subluxation. Vertebral body heights are
preserved.

Alignment is normal.

Unchanged mild disc height loss at L4-L5. Remaining intervertebral
disc spaces are maintained.

The sacroiliac joints are unremarkable.
IMPRESSION: 1.  No acute osseous abnormality.
2. Unchanged mild degenerative disc disease at L4-L5.
